# Patient Record
Sex: Male | Born: 1955 | Race: Black or African American | Hispanic: No | Smoking: Current every day smoker
Health system: Southern US, Community
[De-identification: ages and names within clinical notes are randomized; demographics above are authoritative.]

## PROBLEM LIST (undated history)

## (undated) DIAGNOSIS — G8929 Other chronic pain: Secondary | ICD-10-CM

## (undated) DIAGNOSIS — I10 Essential (primary) hypertension: Secondary | ICD-10-CM

## (undated) DIAGNOSIS — M549 Dorsalgia, unspecified: Secondary | ICD-10-CM

## (undated) HISTORY — PX: BACK SURGERY: SHX140

## (undated) HISTORY — PX: WISDOM TOOTH EXTRACTION: SHX21

---

## 1998-07-10 ENCOUNTER — Emergency Department (HOSPITAL_COMMUNITY): Admission: EM | Admit: 1998-07-10 | Discharge: 1998-07-10 | Payer: Self-pay | Admitting: Emergency Medicine

## 1998-07-10 ENCOUNTER — Encounter: Payer: Self-pay | Admitting: Emergency Medicine

## 1998-10-28 ENCOUNTER — Emergency Department (HOSPITAL_COMMUNITY): Admission: EM | Admit: 1998-10-28 | Discharge: 1998-10-28 | Payer: Self-pay | Admitting: Emergency Medicine

## 1998-10-28 ENCOUNTER — Encounter: Payer: Self-pay | Admitting: Emergency Medicine

## 1999-04-02 ENCOUNTER — Encounter: Payer: Self-pay | Admitting: Emergency Medicine

## 1999-04-02 ENCOUNTER — Emergency Department (HOSPITAL_COMMUNITY): Admission: EM | Admit: 1999-04-02 | Discharge: 1999-04-02 | Payer: Self-pay | Admitting: Emergency Medicine

## 1999-04-04 ENCOUNTER — Emergency Department (HOSPITAL_COMMUNITY): Admission: EM | Admit: 1999-04-04 | Discharge: 1999-04-04 | Payer: Self-pay | Admitting: Emergency Medicine

## 1999-04-09 ENCOUNTER — Emergency Department (HOSPITAL_COMMUNITY): Admission: EM | Admit: 1999-04-09 | Discharge: 1999-04-09 | Payer: Self-pay | Admitting: Emergency Medicine

## 2000-02-11 ENCOUNTER — Emergency Department (HOSPITAL_COMMUNITY): Admission: EM | Admit: 2000-02-11 | Discharge: 2000-02-11 | Payer: Self-pay | Admitting: Emergency Medicine

## 2000-04-16 ENCOUNTER — Emergency Department (HOSPITAL_COMMUNITY): Admission: EM | Admit: 2000-04-16 | Discharge: 2000-04-16 | Payer: Self-pay | Admitting: Emergency Medicine

## 2000-05-22 ENCOUNTER — Encounter: Payer: Self-pay | Admitting: Emergency Medicine

## 2000-05-22 ENCOUNTER — Emergency Department (HOSPITAL_COMMUNITY): Admission: EM | Admit: 2000-05-22 | Discharge: 2000-05-22 | Payer: Self-pay | Admitting: Emergency Medicine

## 2003-04-24 ENCOUNTER — Emergency Department (HOSPITAL_COMMUNITY): Admission: EM | Admit: 2003-04-24 | Discharge: 2003-04-24 | Payer: Self-pay | Admitting: Emergency Medicine

## 2004-04-22 ENCOUNTER — Emergency Department (HOSPITAL_COMMUNITY): Admission: EM | Admit: 2004-04-22 | Discharge: 2004-04-22 | Payer: Self-pay | Admitting: Emergency Medicine

## 2005-01-21 ENCOUNTER — Ambulatory Visit (HOSPITAL_COMMUNITY): Admission: RE | Admit: 2005-01-21 | Discharge: 2005-01-21 | Payer: Self-pay | Admitting: Orthopaedic Surgery

## 2005-02-17 ENCOUNTER — Ambulatory Visit (HOSPITAL_COMMUNITY): Admission: RE | Admit: 2005-02-17 | Discharge: 2005-02-17 | Payer: Self-pay | Admitting: Orthopaedic Surgery

## 2005-06-22 ENCOUNTER — Emergency Department (HOSPITAL_COMMUNITY): Admission: EM | Admit: 2005-06-22 | Discharge: 2005-06-22 | Payer: Self-pay | Admitting: Emergency Medicine

## 2006-06-22 ENCOUNTER — Ambulatory Visit (HOSPITAL_COMMUNITY): Admission: RE | Admit: 2006-06-22 | Discharge: 2006-06-22 | Payer: Self-pay | Admitting: Orthopaedic Surgery

## 2007-03-19 ENCOUNTER — Ambulatory Visit (HOSPITAL_COMMUNITY): Admission: RE | Admit: 2007-03-19 | Discharge: 2007-03-19 | Payer: Self-pay | Admitting: Orthopaedic Surgery

## 2007-03-22 ENCOUNTER — Ambulatory Visit (HOSPITAL_COMMUNITY): Admission: RE | Admit: 2007-03-22 | Discharge: 2007-03-23 | Payer: Self-pay | Admitting: Orthopaedic Surgery

## 2007-07-05 ENCOUNTER — Encounter: Admission: RE | Admit: 2007-07-05 | Discharge: 2007-10-03 | Payer: Self-pay | Admitting: Anesthesiology

## 2007-07-12 ENCOUNTER — Emergency Department (HOSPITAL_COMMUNITY): Admission: EM | Admit: 2007-07-12 | Discharge: 2007-07-12 | Payer: Self-pay | Admitting: Emergency Medicine

## 2007-11-05 ENCOUNTER — Emergency Department (HOSPITAL_COMMUNITY): Admission: EM | Admit: 2007-11-05 | Discharge: 2007-11-05 | Payer: Self-pay | Admitting: Emergency Medicine

## 2007-11-16 ENCOUNTER — Ambulatory Visit: Payer: Self-pay | Admitting: Pain Medicine

## 2008-03-01 ENCOUNTER — Encounter: Admission: RE | Admit: 2008-03-01 | Discharge: 2008-03-01 | Payer: Self-pay

## 2008-05-03 ENCOUNTER — Inpatient Hospital Stay (HOSPITAL_COMMUNITY): Admission: RE | Admit: 2008-05-03 | Discharge: 2008-05-09 | Payer: Self-pay | Admitting: Neurosurgery

## 2008-07-06 ENCOUNTER — Encounter: Admission: RE | Admit: 2008-07-06 | Discharge: 2008-09-07 | Payer: Self-pay | Admitting: Neurosurgery

## 2008-09-12 ENCOUNTER — Encounter: Admission: RE | Admit: 2008-09-12 | Discharge: 2008-12-11 | Payer: Self-pay | Admitting: Neurosurgery

## 2009-02-20 ENCOUNTER — Encounter: Admission: RE | Admit: 2009-02-20 | Discharge: 2009-02-20 | Payer: Self-pay | Admitting: Neurosurgery

## 2009-04-03 ENCOUNTER — Ambulatory Visit: Payer: Self-pay | Admitting: Pain Medicine

## 2009-04-19 ENCOUNTER — Ambulatory Visit: Payer: Self-pay | Admitting: Physician Assistant

## 2009-05-17 ENCOUNTER — Ambulatory Visit: Payer: Self-pay | Admitting: Physician Assistant

## 2009-06-07 ENCOUNTER — Emergency Department (HOSPITAL_COMMUNITY): Admission: EM | Admit: 2009-06-07 | Discharge: 2009-06-07 | Payer: Self-pay | Admitting: Emergency Medicine

## 2009-08-30 ENCOUNTER — Inpatient Hospital Stay (HOSPITAL_COMMUNITY): Admission: EM | Admit: 2009-08-30 | Discharge: 2009-08-31 | Payer: Self-pay | Admitting: Emergency Medicine

## 2009-09-08 HISTORY — PX: COLON SURGERY: SHX602

## 2009-10-02 ENCOUNTER — Ambulatory Visit (HOSPITAL_COMMUNITY): Admission: RE | Admit: 2009-10-02 | Discharge: 2009-10-02 | Payer: Self-pay | Admitting: Gastroenterology

## 2009-10-08 ENCOUNTER — Emergency Department (HOSPITAL_COMMUNITY): Admission: EM | Admit: 2009-10-08 | Discharge: 2009-10-08 | Payer: Self-pay | Admitting: Emergency Medicine

## 2009-10-31 ENCOUNTER — Encounter (INDEPENDENT_AMBULATORY_CARE_PROVIDER_SITE_OTHER): Payer: Self-pay | Admitting: General Surgery

## 2009-10-31 ENCOUNTER — Inpatient Hospital Stay (HOSPITAL_COMMUNITY): Admission: RE | Admit: 2009-10-31 | Discharge: 2009-11-06 | Payer: Self-pay | Admitting: General Surgery

## 2009-12-13 ENCOUNTER — Encounter: Admission: RE | Admit: 2009-12-13 | Discharge: 2009-12-13 | Payer: Self-pay | Admitting: General Surgery

## 2010-05-29 ENCOUNTER — Emergency Department (HOSPITAL_COMMUNITY): Admission: EM | Admit: 2010-05-29 | Discharge: 2010-05-29 | Payer: Self-pay | Admitting: Emergency Medicine

## 2010-09-28 ENCOUNTER — Encounter: Payer: Self-pay | Admitting: Orthopaedic Surgery

## 2010-09-29 ENCOUNTER — Encounter: Payer: Self-pay | Admitting: General Surgery

## 2010-09-29 ENCOUNTER — Encounter: Payer: Self-pay | Admitting: Orthopaedic Surgery

## 2010-11-24 LAB — COMPREHENSIVE METABOLIC PANEL
BUN: 4 mg/dL — ABNORMAL LOW (ref 6–23)
CO2: 27 mEq/L (ref 19–32)
Chloride: 107 mEq/L (ref 96–112)
Creatinine, Ser: 0.91 mg/dL (ref 0.4–1.5)
GFR calc non Af Amer: >60 mL/min (ref >60)
Total Bilirubin: 0.6 mg/dL (ref 0.3–1.2)

## 2010-11-24 LAB — DIFFERENTIAL
Basophils Absolute: 0 10*3/uL (ref 0.0–0.1)
Lymphocytes Relative: 56 % — ABNORMAL HIGH (ref 12–46)
Neutro Abs: 1.4 10*3/uL — ABNORMAL LOW (ref 1.7–7.7)
Neutrophils Relative %: 33 % — ABNORMAL LOW (ref 43–77)

## 2010-11-24 LAB — CBC
HCT: 40.5 % (ref 39.0–52.0)
MCV: 96.5 fL (ref 78.0–100.0)
RBC: 4.2 MIL/uL — ABNORMAL LOW (ref 4.22–5.81)
WBC: 4.3 10*3/uL (ref 4.0–10.5)

## 2010-11-24 LAB — URINALYSIS, ROUTINE W REFLEX MICROSCOPIC
Bilirubin Urine: NEGATIVE
Nitrite: NEGATIVE
Specific Gravity, Urine: 1.022 (ref 1.005–1.030)
Urobilinogen, UA: 0.2 mg/dL (ref 0.0–1.0)

## 2010-11-24 LAB — LIPASE, BLOOD: Lipase: 38 U/L (ref 11–59)

## 2010-11-27 LAB — CBC
HCT: 34.7 % — ABNORMAL LOW (ref 39.0–52.0)
Hemoglobin: 12.8 g/dL — ABNORMAL LOW (ref 13.0–17.0)
MCHC: 34 g/dL (ref 30.0–36.0)
MCHC: 34.3 g/dL (ref 30.0–36.0)
MCHC: 34.5 g/dL (ref 30.0–36.0)
MCHC: 34.5 g/dL (ref 30.0–36.0)
MCV: 94.5 fL (ref 78.0–100.0)
MCV: 95 fL (ref 78.0–100.0)
MCV: 95.3 fL (ref 78.0–100.0)
Platelets: 184 10*3/uL (ref 150–400)
Platelets: 233 10*3/uL (ref 150–400)
RBC: 3.68 MIL/uL — ABNORMAL LOW (ref 4.22–5.81)
RBC: 3.9 MIL/uL — ABNORMAL LOW (ref 4.22–5.81)
RBC: 4.59 MIL/uL (ref 4.22–5.81)
RBC: 4.97 MIL/uL (ref 4.22–5.81)
RDW: 12.5 % (ref 11.5–15.5)

## 2010-11-27 LAB — CHROMOGRANIN A: Chromogranin A: 6 ng/mL (ref 1.9–15.0)

## 2010-11-27 LAB — DIFFERENTIAL
Eosinophils Relative: 1 % (ref 0–5)
Lymphocytes Relative: 42 % (ref 12–46)
Lymphs Abs: 3.9 10*3/uL (ref 0.7–4.0)
Monocytes Absolute: 0.8 10*3/uL (ref 0.1–1.0)
Monocytes Relative: 8 % (ref 3–12)

## 2010-11-27 LAB — COMPREHENSIVE METABOLIC PANEL
AST: 50 U/L — ABNORMAL HIGH (ref 0–37)
Albumin: 4.9 g/dL (ref 3.5–5.2)
Calcium: 9.5 mg/dL (ref 8.4–10.5)
Chloride: 105 mEq/L (ref 96–112)
Creatinine, Ser: 1.47 mg/dL (ref 0.4–1.5)
GFR calc Af Amer: >60 mL/min (ref >60)
Total Bilirubin: 1.8 mg/dL — ABNORMAL HIGH (ref 0.3–1.2)
Total Protein: 8.7 g/dL — ABNORMAL HIGH (ref 6.0–8.3)

## 2010-11-27 LAB — BASIC METABOLIC PANEL
BUN: 2 mg/dL — ABNORMAL LOW (ref 6–23)
BUN: 3 mg/dL — ABNORMAL LOW (ref 6–23)
CO2: 25 mEq/L (ref 19–32)
CO2: 26 mEq/L (ref 19–32)
CO2: 27 mEq/L (ref 19–32)
Calcium: 10.6 mg/dL — ABNORMAL HIGH (ref 8.4–10.5)
Chloride: 107 mEq/L (ref 96–112)
Chloride: 112 mEq/L (ref 96–112)
Creatinine, Ser: 0.96 mg/dL (ref 0.4–1.5)
Creatinine, Ser: 1.06 mg/dL (ref 0.4–1.5)
GFR calc Af Amer: >60 mL/min (ref >60)
GFR calc Af Amer: >60 mL/min (ref >60)
Potassium: 3.4 mEq/L — ABNORMAL LOW (ref 3.5–5.1)
Potassium: 3.5 mEq/L (ref 3.5–5.1)
Sodium: 143 mEq/L (ref 135–145)

## 2010-12-04 ENCOUNTER — Inpatient Hospital Stay (INDEPENDENT_AMBULATORY_CARE_PROVIDER_SITE_OTHER)
Admission: RE | Admit: 2010-12-04 | Discharge: 2010-12-04 | Disposition: A | Discharge status: Home / Self Care | Payer: Medicaid Other | Source: Ambulatory Visit | Attending: Emergency Medicine | Admitting: Emergency Medicine

## 2010-12-04 DIAGNOSIS — T7840XA Allergy, unspecified, initial encounter: Secondary | ICD-10-CM

## 2010-12-09 LAB — COMPREHENSIVE METABOLIC PANEL
ALT: 58 U/L — ABNORMAL HIGH (ref 0–53)
AST: 54 U/L — ABNORMAL HIGH (ref 0–37)
Albumin: 4.5 g/dL (ref 3.5–5.2)
Alkaline Phosphatase: 67 U/L (ref 39–117)
CO2: 23 mEq/L (ref 19–32)
Chloride: 103 mEq/L (ref 96–112)
GFR calc Af Amer: >60 mL/min (ref >60)
GFR calc non Af Amer: >60 mL/min (ref >60)
Potassium: 4.3 mEq/L (ref 3.5–5.1)
Sodium: 138 mEq/L (ref 135–145)
Total Bilirubin: 1.2 mg/dL (ref 0.3–1.2)

## 2010-12-09 LAB — DIFFERENTIAL
Basophils Absolute: 0.1 10*3/uL (ref 0.0–0.1)
Eosinophils Absolute: 0 10*3/uL (ref 0.0–0.7)
Eosinophils Relative: 0 % (ref 0–5)
Lymphocytes Relative: 24 % (ref 12–46)
Monocytes Absolute: 0.2 10*3/uL (ref 0.1–1.0)

## 2010-12-09 LAB — CBC
HCT: 38.7 % — ABNORMAL LOW (ref 39.0–52.0)
Hemoglobin: 13.5 g/dL (ref 13.0–17.0)
MCHC: 35 g/dL (ref 30.0–36.0)
MCV: 94.6 fL (ref 78.0–100.0)
MCV: 95.2 fL (ref 78.0–100.0)
Platelets: 224 10*3/uL (ref 150–400)
RBC: 4.09 MIL/uL — ABNORMAL LOW (ref 4.22–5.81)
RBC: 4.9 MIL/uL (ref 4.22–5.81)
WBC: 7.3 10*3/uL (ref 4.0–10.5)

## 2010-12-09 LAB — BASIC METABOLIC PANEL
CO2: 24 mEq/L (ref 19–32)
Chloride: 107 mEq/L (ref 96–112)
GFR calc Af Amer: >60 mL/min (ref >60)
Sodium: 137 mEq/L (ref 135–145)

## 2010-12-09 LAB — LIPASE, BLOOD: Lipase: 19 U/L (ref 11–59)

## 2011-01-21 NOTE — Op Note (Signed)
[name], SWEEDEN NO.:  [id number]   MEDICAL RECORD NO.:  [id number]          PATIENT TYPE:  INP   LOCATION:  3001                         FACILITY:  [facility]   PHYSICIAN:  Hilda Lias, M.D.   DATE OF BIRTH:  [birth year]   DATE OF PROCEDURE:  05/03/2008  DATE OF DISCHARGE:                               OPERATIVE REPORT   PREOPERATIVE DIAGNOSIS:  Degenerative disk disease L4-L5, L5-S1 with  chronic radiculopathy status post right L4-L5, L5-S1 diskectomy.   POSTOPERATIVE DIAGNOSIS:  Degenerative disk disease L4-L5, L5-S1 with  chronic radiculopathy status post right L4-L5, L5-S1 diskectomy.   PROCEDURE:  Bilateral L4-L5 laminectomy and facetectomy; bilateral L4-L5  diskectomy; unilaterally left L5-S1 diskectomy; interbody fusion with  cages, bilateral at the level of L4-L5, unilateral at the level L5-S1;  pedicle screws L4, L5, and S1; posterolateral arthrodesis with BMP and  autograft; Cell Saver; C-arm.   SURGEON:  Hilda Lias, MD   ASSISTANT:  Cristi Loron, MD   CLINICAL HISTORY:  Mr. Granquist is a gentleman who had surgery elsewhere  more than year ago and since then he had been complaining of back pain  radiating to both legs, left worse than the right one.  The patient has  failed conservative treatment including aggressive pain management.  X-  rays showed that he has severe degenerative disk disease at L4-L5 and L5-  S1 __________ space.  Surgery was advised and the risks were explained  to him in the history and physical.   PROCEDURE:  The patient was taken to the OR, and after intubation he was  positioned in a prone manner.  The skin was cleaned with DuraPrep.  Midline incision along the __________ was made and muscles were  retracted laterally.  We found quite a bit of scar tissue at the level  of L4-L5 and L5-S1 on the right side.  X-rays showed that indeed we were  at the level L4-L5.  From then on, we removed the spinous process of  L4-  L5 and L5-S1 and then the lamina.  On the left side, we did facetectomy  to be able to get into the space.  The most difficult part was to do  lysis of tissue.  On the right side also it was quite difficult, but at  the end we were able to do the S1 nerve root.  At the level of L4-L5, we  entered the disk space and bilateral diskectomy was accomplished.  Then  2 cages of 8 x 22 with BMP and autograft were inserted.  At the L5-S1,  we entered the disk space on the left side and total diskectomy was done  from the left side.  On the right side, it was quite difficult to  __________ space.  Because of that, we introduced a __________ on the  left side with BMP.  Using the C-arm, we brought the pedicle at L4, L5,  and S1.  We introduced 6 pedicles of 5.5 x 45.  They were connected with  __________ and secured in place with caps.  The  foramen was wide opened.  The worst nerve, because of the __________,  was the L4 in the right side.  Then, we removed the __________ lateral  aspect of the facet and the transverse process of L4, L5, and S1 and a  mix of BMP and autograft was used for arthrodesis.  From then on, the  wound was closed with Vicryl and staples.           ______________________________  Hilda Lias, M.D.     EB/MEDQ  D:  05/03/2008  T:  05/04/2008  Job:  [id number]

## 2011-01-21 NOTE — H&P (Signed)
NAMEJARYAN, [name] NO.:  [id number]   MEDICAL RECORD NO.:  [id number]          PATIENT TYPE:  INP   LOCATION:  3001                         FACILITY:  [facility]   PHYSICIAN:  Hilda Lias, M.D.   DATE OF BIRTH:  1955/10/06   DATE OF ADMISSION:  05/03/2008  DATE OF DISCHARGE:                              HISTORY & PHYSICAL   [name] is a gentleman who has been seen by me since June of this  year complaining of back pain radiating down to the right leg for almost  a year.  In the past, he had surgery by Dr. Ophelia Charter at the L4-L5, L5-S1.  According to him, he did not do any better.  He has been followed by the  [facility] in [place] without any improvement.  It is quite  miserable.  The patient had a x-ray and he was sent to [place] for further  evaluation.   PAST MEDICAL HISTORY:  L4-L5, L5-S1 lumbar diskectomy.   ALLERGIES:  He is not allergic to any medication.   SOCIAL HISTORY:  He does not drink.  He smokes 3 cigarettes a day.   FAMILY HISTORY:  Unremarkable.   REVIEW OF SYSTEMS:  Positive for high blood pressure and back pain.   PHYSICAL EXAMINATION:  HEAD, EARS, NOSE, AND THROAT:  Normal.  NECK:  Normal.  LUNGS:  Clear.  HEART:  Heart sound normal.  ABDOMEN:  Normal.  EXTREMITIES:  Normal pulse.  NEURO:  He has decreased flexibility of the lumbar spine, his straight  leg raising is positive at 30 degrees.  He has a scar in the lumbar  area.   The x-ray showed that he has had severe degenerative disk disease at L4-  L5, L5-S1 with bilateral foraminal stenosis.   CLINICAL IMPRESSION:  1. Degenerative disk disease L4-L5, L5-S1with atrophy of the right      calf.  2. Status post L4-L5, L5-S1 laminectomy.   RECOMMENDATIONS:  The patient is being admitted for surgery.  Procedure  will be L4-L5, L5-S1 discectomy, interbody fusion with pedicle screw and  posterolateral arthrodesis.  The patient is fully aware of the risks  such as infection, CSF  leak, worsening pain, paralysis, no improvement  whatsoever, and need for further surgery.          ______________________________  Hilda Lias, M.D.    EB/MEDQ  D:  05/03/2008  T:  05/03/2008  Job:  [id number]

## 2011-01-21 NOTE — Discharge Summary (Signed)
[name], [name]             ACCOUNT NO.:  [id number]   MEDICAL RECORD NO.:  [id number]          PATIENT TYPE:  INP   LOCATION:  3001                         FACILITY:  [facility]   PHYSICIAN:  Hilda Lias, M.D.   DATE OF BIRTH:  1955/10/15   DATE OF ADMISSION:  05/03/2008  DATE OF DISCHARGE:  05/09/2008                               DISCHARGE SUMMARY   ADMISSION DIAGNOSES:  1. Degenerative disk disease, L4-L5 and L5-S1 with a chronic      radiculopathy.  2. Status post right L4-L5 and L5-S1 diskectomy.   FINAL DIAGNOSES:  1. Degenerative disk disease, L4-L5 and L5-S1 with a chronic      radiculopathy.  2. Status post right L4-L5 and L5-S1 diskectomy.   CLINICAL HISTORY:  The patient was admitted because of back pain rushing  to the right leg.  This gentleman in the past had surgery by orthopedic  surgeon.  Since then, he told me that he never got better, continue to  have a bit of more back pain.  X-ray shows severe degenerative disk  disease at the level of L4-L5 and L5-S1.  This patient was to proceed  with surgery.   LABORATORY DATA:  Normal.   COURSE IN THE HOSPITAL:  The patient was taken to surgery, L4-L5 and L5-  S1 diskectomy, interbody fusion with pedicle screw was done.  Today, the  patient has improved.  He still has limitation with walking.  The wound  looks fine.  He is being discharged, to be followed by me and by the  physical therapy.   CONDITION ON DISCHARGE:  Stable.   MEDICATION:  Oxycodone and diazepam.   DIET:  Regular.   ACTIVITY:  Not to drive for at least 3 weeks.   FOLLOWUP:  He will be seen in my office in 10 days to have the staples  out.           ______________________________  Hilda Lias, M.D.     EB/MEDQ  D:  05/09/2008  T:  05/09/2008  Job:  [id number]

## 2011-01-21 NOTE — Op Note (Signed)
[name], [name]             ACCOUNT NO.:  [id number]   MEDICAL RECORD NO.:  [id number]          PATIENT TYPE:  OIB   LOCATION:  5028                         FACILITY:  [facility]   PHYSICIAN:  Mark C. Ophelia Charter, M.D.    DATE OF BIRTH:  05/05/1956   DATE OF PROCEDURE:  03/22/2007  DATE OF DISCHARGE:                               OPERATIVE REPORT   PREOPERATIVE DIAGNOSES:  1. 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  2. Right L5-S1 foraminal stenosis.   POSTOPERATIVE DIAGNOSES:  1. 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  2. Right L5-S1 foraminal stenosis.   PROCEDURE:  1. Right L4-5 microdiskectomy.  2. Right L5-S1 foraminotomy.   SURGEON:  Annell Greening, MD   ASSISTANT:  RNFA.   ESTIMATED BLOOD LOSS:  Minimal.   DESCRIPTION OF PROCEDURE:  After the induction of general anesthesia  with orotracheal intubation, the patient was placed in the prone  position with standard prepping and draping with the Andrews frame in  the kneeling position.  A time-out procedure was taken.  Preoperative  antibiotics were given.  Area was squared with towels, Betadine and Vi-  Drape.  The patient is very thin and an incision was made starting a  couple millimeters to the right of the midline and extended distally to  cover both the 4-5 and 5-1 space.  Subperiosteal dissection was  performed and a Penfield 4 retractor was placed down to the level of the  ligamentum.  The patient had a very narrow space between the L4 and L5  lamina with minimal gap between the lamina at this level up.  [name]  was placed on top.  X-ray was taken, which showed that it was above the  4-5 space.  Bone was removed once a small curette been used to create  enough space to get a 2-mm Kerrison in between.  There was facet  overhang of bone and thick ligamentum which were excised.  There was  extensive fibrotic tissue in the lateral gutter at the 4-5 space and  once the floor of the canal was found with extensive fibrosis,  the top  portion of the lamina of 5 was removed as well, and a Penfield 4 was  placed out the base of the canal directly over the L5 vertebra and  halfway between the 2 disk spaces, confirming that this was the  appropriate spot.  At the 4-5 level, there was extensive disk  herniation.  A portion of it was calcified, which required  microdissection with the operative microscope, cutting pieces of the  calcified disk with a combination of scalpel and pushing it down with  Epstein curettes, grasping with a micro-pituitary and then sharp  excision and blunt dissection with curettes to remove the pieces.  Nerve  root was extensively fibrotic, stuck from the level the disk to the  midportion of the level of the pedicle and disk material was pushing the  dura from the pedicle away.  Finally, a plane was identified distal to  this area, followed up proximal and then coming down from the top.  A  nerve root shoulder was identified.  Just distal to the nerve root  takeoff at the level of the top of the pedicle, a small bleb occurred.  There was no spinal fluid leak.  It was slightly bigger than the size of  an 18-gauge spinal needle.  It was carefully inspected and some Tisseel  was mixed and just dripped over it.  A paddy was used to protect it  while the Tisseel was being mixed and continued dissection, removing  chunks of ligament and the old disk material out.  Overhanging spurs  were removed and nerve root was followed out the foramina past the  pedicle.  There were no fragments out the foramina.  Once the nerve root  was completely freed, finally a hockey-stick could be placed anterior to  the dura, passes were made inside the disk space, which would barely  allow a micro-pituitary in.  Disk material had extruded out, had  calcified and was causing the compression.  Once this was decompressed  down to the bony floor, nerve root was free.  Continued removal of the  L5 hemilaminotomy was  performed until the disk space at 5-1 was  encountered and then the foramina was enlarged.  Palpation with a hockey-  stick showed that was a facet overhang causing narrowing; once the bone  spurs from the facet had been trimmed back, this gave freedom to the  nerve root.  After irrigation with saline solution, inspection once  again in all areas was performed.  Tisseel was dripped over the area,  which sealed a tiny bleb, and again, there was no spinal fluid leakage.  Both nerve roots were checked again and were free.  A hockey-stick could  be placed anterior to the dura and out the foramina with no areas of  compression.  Operative microscope was removed.  The area was irrigated,  fascia closed 0 Vicryl, 2-0 in the subcutaneous tissue, 4-0 Vicryl  subcuticular closure, tincture of Benzoin, Steri-Strips, Marcaine  infiltration of the skin, postop dressing and transferred to the  recovery room.  Instrument count and needle count were correct.      Mark C. Ophelia Charter, M.D.  Electronically Signed     MCY/MEDQ  D:  03/22/2007  T:  03/23/2007  Job:  [id number]

## 2011-05-30 LAB — CBC
MCHC: 34.3
MCV: 92.8
Platelets: 241
RDW: 13.4

## 2011-05-30 LAB — URINALYSIS, ROUTINE W REFLEX MICROSCOPIC
Bilirubin Urine: NEGATIVE
Leukocytes, UA: NEGATIVE
Nitrite: NEGATIVE
Specific Gravity, Urine: 1.024
pH: 8.5 — ABNORMAL HIGH

## 2011-05-30 LAB — DIFFERENTIAL
Eosinophils Relative: 1
Lymphocytes Relative: 26
Lymphs Abs: 1.1

## 2011-05-30 LAB — COMPREHENSIVE METABOLIC PANEL
AST: 44 — ABNORMAL HIGH
Albumin: 4
CO2: 21
Calcium: 9.6
Creatinine, Ser: 0.94
GFR calc Af Amer: >60
GFR calc non Af Amer: >60

## 2011-05-30 LAB — URINE MICROSCOPIC-ADD ON

## 2011-05-30 LAB — LIPASE, BLOOD: Lipase: 27

## 2011-06-17 LAB — URINALYSIS, ROUTINE W REFLEX MICROSCOPIC
Ketones, ur: 15 — AB
Nitrite: NEGATIVE
Protein, ur: NEGATIVE
Urobilinogen, UA: 0.2
pH: 8.5 — ABNORMAL HIGH

## 2011-06-17 LAB — COMPREHENSIVE METABOLIC PANEL
ALT: 41
Albumin: 3.6
Alkaline Phosphatase: 59
Calcium: 9.3
Potassium: 4.4
Sodium: 140
Total Protein: 6.5

## 2011-06-17 LAB — DIFFERENTIAL
Basophils Relative: 1
Eosinophils Absolute: 0
Lymphs Abs: 0.8
Monocytes Absolute: 0.4
Monocytes Relative: 7
Neutro Abs: 4.6

## 2011-06-17 LAB — CBC
Platelets: 203
RDW: 12.7

## 2011-06-24 LAB — URINALYSIS, ROUTINE W REFLEX MICROSCOPIC
Glucose, UA: NEGATIVE
Ketones, ur: NEGATIVE
Nitrite: NEGATIVE
Protein, ur: NEGATIVE
Urobilinogen, UA: 0.2

## 2011-06-24 LAB — COMPREHENSIVE METABOLIC PANEL
AST: 50 — ABNORMAL HIGH
BUN: 7
CO2: 27
Calcium: 10.2
Chloride: 104
Creatinine, Ser: 0.9
GFR calc Af Amer: >60
GFR calc non Af Amer: >60
Total Bilirubin: 1.1

## 2011-06-24 LAB — DIFFERENTIAL
Basophils Absolute: 0.1
Eosinophils Relative: 3
Lymphocytes Relative: 51 — ABNORMAL HIGH
Lymphs Abs: 3.5 — ABNORMAL HIGH
Neutro Abs: 2.4
Neutrophils Relative %: 36 — ABNORMAL LOW

## 2011-06-24 LAB — CBC
HCT: 43
MCHC: 33.9
MCV: 92.8
RBC: 4.63

## 2011-06-24 LAB — PROTIME-INR
INR: 1
Prothrombin Time: 12.9

## 2011-06-24 LAB — APTT: aPTT: 28

## 2011-09-13 ENCOUNTER — Other Ambulatory Visit: Payer: Self-pay | Admitting: Cardiology

## 2011-10-04 ENCOUNTER — Other Ambulatory Visit: Payer: Self-pay | Admitting: Cardiology

## 2011-10-31 ENCOUNTER — Other Ambulatory Visit: Payer: Self-pay | Admitting: Cardiology

## 2012-03-08 ENCOUNTER — Other Ambulatory Visit: Payer: Self-pay | Admitting: Cardiology

## 2012-05-02 ENCOUNTER — Encounter (HOSPITAL_COMMUNITY): Payer: Self-pay | Admitting: Emergency Medicine

## 2012-05-02 ENCOUNTER — Emergency Department (HOSPITAL_COMMUNITY)
Admission: EM | Admit: 2012-05-02 | Discharge: 2012-05-02 | Disposition: A | Discharge status: Home / Self Care | Payer: Medicaid Other | Attending: Emergency Medicine | Admitting: Emergency Medicine

## 2012-05-02 ENCOUNTER — Emergency Department (HOSPITAL_COMMUNITY): Payer: Medicaid Other

## 2012-05-02 DIAGNOSIS — M5416 Radiculopathy, lumbar region: Secondary | ICD-10-CM

## 2012-05-02 DIAGNOSIS — I10 Essential (primary) hypertension: Secondary | ICD-10-CM | POA: Insufficient documentation

## 2012-05-02 DIAGNOSIS — IMO0002 Reserved for concepts with insufficient information to code with codable children: Secondary | ICD-10-CM | POA: Insufficient documentation

## 2012-05-02 DIAGNOSIS — G8929 Other chronic pain: Secondary | ICD-10-CM | POA: Insufficient documentation

## 2012-05-02 HISTORY — DX: Other chronic pain: G89.29

## 2012-05-02 HISTORY — DX: Dorsalgia, unspecified: M54.9

## 2012-05-02 HISTORY — DX: Essential (primary) hypertension: I10

## 2012-05-02 MED ORDER — HYDROMORPHONE HCL PF 2 MG/ML IJ SOLN
2.0000 mg | Freq: Once | INTRAMUSCULAR | Status: AC
  Administered 2012-05-02: 2 mg via INTRAMUSCULAR
  Filled 2012-05-02: qty 1

## 2012-05-02 MED ORDER — CYCLOBENZAPRINE HCL 10 MG PO TABS: 10.0000 mg | ORAL_TABLET | Freq: Two times a day (BID) | ORAL | Status: AC | PRN

## 2012-05-02 MED ORDER — OXYCODONE-ACETAMINOPHEN 10-325 MG PO TABS: 1.0000 | ORAL_TABLET | ORAL | Status: AC | PRN

## 2012-05-02 NOTE — ED Notes (Signed)
Pt c/o right lower back pain into hip and and down right leg x 2 days; pt sts hx of chronic back pain

## 2012-05-02 NOTE — ED Provider Notes (Addendum)
History  Scribed for Todd Sprout, MD, the patient was seen in room TR11C/TR11C. This chart was scribed by Candelaria Stagers. The patient's care started at 12:01 PM   CSN: [id number]  Arrival date & time 05/02/12  1025   First MD Initiated Contact with Patient 05/02/12 1141      Chief Complaint  Patient presents with  . Back Pain  . Leg Pain     Patient is a 56 y.o. male presenting with leg pain. The history is provided by the patient. No language interpreter was used.  Leg Pain    [name] is a 56 y.o. male who presents to the Emergency Department complaining of groin pain, right hip pain, and right lower back pain that started four days ago and became worse last night.  He denies testicular swelling or trouble urinating.  Pt reports that he takes oxycodone for chronic back pain daily and ran out yesterday.  He has no h/o hip pain.  He is also experiencing intermittent spasms.    Past Medical History  Diagnosis Date  . Chronic back pain   . Hypertension     History reviewed. No pertinent past surgical history.  History reviewed. No pertinent family history.  History  Substance Use Topics  . Smoking status: Never Smoker   . Smokeless tobacco: Not on file  . Alcohol Use: Yes     occasional      Review of Systems  Genitourinary: Negative for dysuria, scrotal swelling and difficulty urinating.       Groin pain   Musculoskeletal: Positive for back pain and arthralgias (right hip pain).  All other systems reviewed and are negative.    Allergies  Review of patient's allergies indicates no known allergies.  Home Medications   Current Outpatient Rx  Name Route Sig Dispense Refill  . AMLODIPINE BESYLATE 10 MG PO TABS Oral Take 10 mg by mouth daily.    Marland Kitchen BENAZEPRIL HCL 20 MG PO TABS Oral Take 20 mg by mouth 2 (two) times daily.     . CHOLINE FENOFIBRATE 135 MG PO CPDR Oral Take 135 mg by mouth daily.      BP 108/64  Pulse 79  Temp 98.2 F (36.8 C)  (Oral)  Resp 16  SpO2 96%  Physical Exam  Nursing note and vitals reviewed. Constitutional: He is oriented to person, place, and time. He appears well-developed and well-nourished. No distress.  HENT:  Head: Normocephalic and atraumatic.  Eyes: Conjunctivae are normal.  Neck: Normal range of motion.  Pulmonary/Chest: Effort normal.  Musculoskeletal:       Pain in the L-3 distribution radiating down the right leg with tenderness in the right inguinal area without rash, lymphadenopathy.  Good femoral pulses.  Pain with movement of the right leg.  Normal strength.  Mild tenderness in the para lumbar region.    Neurological: He is alert and oriented to person, place, and time.  Skin: Skin is warm and dry. He is not diaphoretic.  Psychiatric: He has a normal mood and affect.    ED Course  Procedures   DIAGNOSTIC STUDIES: Oxygen Saturation is 96% on room air, normal by my interpretation.    COORDINATION OF CARE:    Labs Reviewed - No data to display Dg Hip Complete Right  05/02/2012  *RADIOLOGY REPORT*  Clinical Data: Right hip pain.  RIGHT HIP - COMPLETE 2+ VIEW  Comparison: None  Findings: Both hips are normally located.  Mild symmetric hip joint degenerative changes  bilaterally.  No acute fracture or plain film evidence of avascular necrosis.  The pubic symphysis and SI joints are intact.  Lower lumbar fusion hardware is noted. Right scrotal density appears to be too dense to be a normal testicle.  It may be an implant.  IMPRESSION: No acute bony findings.   Original Report Authenticated By: P. Loralie Champagne, M.D.      1. Lumbar radiculopathy       MDM   Pt with gradual onset of back pain suggestive of radiculopathy in the L2-3 distribution vs hip pathology.  No neurovascular compromise and no incontinence.  Pt has no infectious sx, hx of CA  or other red flags concerning for pathologic back pain.  Pt is able to ambulate but is painful.  Normal strength and reflexes on exam.   Denies trauma. Will give pt pain control and to return for developement of above sx.  Hip films pending.  1:33 PM Plain films neg and pt feels better.  Will d/c home.  I personally performed the services described in this documentation, which was scribed in my presence.  The recorded information has been reviewed and considered.        Todd Sprout, MD 05/02/12 1227  Todd Sprout, MD 05/02/12 1333  Todd Sprout, MD 05/02/12 1335

## 2013-05-14 ENCOUNTER — Encounter (HOSPITAL_COMMUNITY): Payer: Self-pay | Admitting: Emergency Medicine

## 2013-05-14 ENCOUNTER — Emergency Department (HOSPITAL_COMMUNITY)
Admission: EM | Admit: 2013-05-14 | Discharge: 2013-05-14 | Disposition: A | Discharge status: Home / Self Care | Payer: Medicaid Other | Attending: Emergency Medicine | Admitting: Emergency Medicine

## 2013-05-14 DIAGNOSIS — M546 Pain in thoracic spine: Secondary | ICD-10-CM | POA: Insufficient documentation

## 2013-05-14 DIAGNOSIS — Z79899 Other long term (current) drug therapy: Secondary | ICD-10-CM | POA: Insufficient documentation

## 2013-05-14 DIAGNOSIS — I10 Essential (primary) hypertension: Secondary | ICD-10-CM | POA: Insufficient documentation

## 2013-05-14 DIAGNOSIS — M549 Dorsalgia, unspecified: Secondary | ICD-10-CM

## 2013-05-14 MED ORDER — OXYCODONE-ACETAMINOPHEN 5-325 MG PO TABS
1.0000 | ORAL_TABLET | Freq: Once | ORAL | Status: AC
  Administered 2013-05-14: 1 via ORAL
  Filled 2013-05-14: qty 1

## 2013-05-14 MED ORDER — HYDROCODONE-ACETAMINOPHEN 5-325 MG PO TABS: 1.0000 | ORAL_TABLET | Freq: Four times a day (QID) | ORAL | Status: DC | PRN

## 2013-05-14 MED ORDER — CYCLOBENZAPRINE HCL 10 MG PO TABS: 10.0000 mg | ORAL_TABLET | Freq: Three times a day (TID) | ORAL | Status: DC | PRN

## 2013-05-14 MED ORDER — KETOROLAC TROMETHAMINE 60 MG/2ML IM SOLN
60.0000 mg | Freq: Once | INTRAMUSCULAR | Status: AC
  Administered 2013-05-14: 60 mg via INTRAMUSCULAR
  Filled 2013-05-14: qty 2

## 2013-05-14 MED ORDER — PREDNISONE 50 MG PO TABS: 50.0000 mg | ORAL_TABLET | Freq: Every day | ORAL | Status: DC

## 2013-05-14 MED ORDER — PREDNISONE 20 MG PO TABS
50.0000 mg | ORAL_TABLET | Freq: Once | ORAL | Status: AC
  Administered 2013-05-14: 50 mg via ORAL
  Filled 2013-05-14: qty 3

## 2013-05-14 NOTE — ED Notes (Signed)
Pt. Stated, I'm having severe back pain since yesterday, no injury. Previous back surgeries.  My left arm also started hurting.

## 2013-05-14 NOTE — ED Provider Notes (Signed)
CSN: [id number]     Arrival date & time 05/14/13  0845 History   First MD Initiated Contact with Patient 05/14/13 0900     Chief Complaint  Patient presents with  . Back Pain   (Consider location/radiation/quality/duration/timing/severity/associated sxs/prior Treatment) HPI Patient presents emergency department with upper back pain.  Patient, states, that previous pain in his upper back.  Patient, states, that symptoms started 2 days, ago.  Patient denies chest pain, shortness of breath, nausea, vomiting, abdominal pain, weakness, numbness, dizziness, fever, cough, or syncope.  The patient, states, that he did not take any medications prior to arrival.  Patient, states, that palpation and movement make his pain, worse.  Patient, states nothing seems to make his condition, better Past Medical History  Diagnosis Date  . Chronic back pain   . Hypertension    History reviewed. No pertinent past surgical history. No family history on file. History  Substance Use Topics  . Smoking status: Never Smoker   . Smokeless tobacco: Not on file  . Alcohol Use: Yes     Comment: occasional    Review of Systems All other systems negative except as documented in the HPI. All pertinent positives and negatives as reviewed in the HPI. Allergies  Review of patient's allergies indicates no known allergies.  Home Medications   Current Outpatient Rx  Name  Route  Sig  Dispense  Refill  . amLODipine (NORVASC) 10 MG tablet   Oral   Take 10 mg by mouth daily.         . benazepril (LOTENSIN) 20 MG tablet   Oral   Take 20 mg by mouth 2 (two) times daily.          . Choline Fenofibrate (TRILIPIX) 135 MG capsule   Oral   Take 135 mg by mouth daily.          BP 104/67  Pulse 65  Temp(Src) 98 F (36.7 C) (Oral)  Resp 22  SpO2 97% Physical Exam  Nursing note and vitals reviewed. Constitutional: He is oriented to person, place, and time. He appears well-developed and well-nourished. No  distress.  HENT:  Head: Normocephalic and atraumatic.  Eyes: Pupils are equal, round, and reactive to light.  Cardiovascular: Normal rate, regular rhythm and normal heart sounds.   Pulmonary/Chest: Effort normal and breath sounds normal. No respiratory distress.  Neurological: He is alert and oriented to person, place, and time. He has normal strength. He displays normal reflexes. No sensory deficit. He exhibits normal muscle tone. Coordination normal. GCS eye subscore is 4. GCS verbal subscore is 5. GCS motor subscore is 6.    ED Course  Procedures (including critical care time)  Patient will be treated for upper lumbar strain.  Patient does not have any neurological deficits noted on exam with normal reflexes.  Patient will be given followup with the [facility].  Told to use ice and heat on his back MDM      Carlyle Dolly, PA-C 05/14/13 [phone]

## 2013-05-14 NOTE — ED Notes (Signed)
Pt has ride home.

## 2013-05-16 NOTE — ED Provider Notes (Signed)
History/physical exam/procedure(s) were performed by non-physician practitioner and as supervising physician I was immediately available for consultation/collaboration. I have reviewed all notes and am in agreement with care and plan.   Hilario Quarry, MD 05/16/13 [phone]

## 2013-06-01 ENCOUNTER — Encounter (HOSPITAL_COMMUNITY): Payer: Self-pay | Admitting: Nurse Practitioner

## 2013-06-01 ENCOUNTER — Emergency Department (HOSPITAL_COMMUNITY)
Admission: EM | Admit: 2013-06-01 | Discharge: 2013-06-01 | Disposition: A | Discharge status: Home / Self Care | Payer: Medicaid Other | Attending: Emergency Medicine | Admitting: Emergency Medicine

## 2013-06-01 DIAGNOSIS — I10 Essential (primary) hypertension: Secondary | ICD-10-CM | POA: Insufficient documentation

## 2013-06-01 DIAGNOSIS — Z79899 Other long term (current) drug therapy: Secondary | ICD-10-CM | POA: Insufficient documentation

## 2013-06-01 DIAGNOSIS — R209 Unspecified disturbances of skin sensation: Secondary | ICD-10-CM | POA: Insufficient documentation

## 2013-06-01 DIAGNOSIS — IMO0002 Reserved for concepts with insufficient information to code with codable children: Secondary | ICD-10-CM | POA: Insufficient documentation

## 2013-06-01 DIAGNOSIS — M549 Dorsalgia, unspecified: Secondary | ICD-10-CM | POA: Insufficient documentation

## 2013-06-01 DIAGNOSIS — G8929 Other chronic pain: Secondary | ICD-10-CM

## 2013-06-01 DIAGNOSIS — F17200 Nicotine dependence, unspecified, uncomplicated: Secondary | ICD-10-CM | POA: Insufficient documentation

## 2013-06-01 MED ORDER — KETOROLAC TROMETHAMINE 60 MG/2ML IM SOLN
60.0000 mg | Freq: Once | INTRAMUSCULAR | Status: AC
  Administered 2013-06-01: 60 mg via INTRAMUSCULAR
  Filled 2013-06-01: qty 2

## 2013-06-01 MED ORDER — DIAZEPAM 5 MG/ML IJ SOLN
5.0000 mg | Freq: Once | INTRAMUSCULAR | Status: AC
  Administered 2013-06-01: 5 mg via INTRAMUSCULAR

## 2013-06-01 MED ORDER — IBUPROFEN 600 MG PO TABS: 600.0000 mg | ORAL_TABLET | Freq: Four times a day (QID) | ORAL | Status: DC | PRN

## 2013-06-01 MED ORDER — DIAZEPAM 5 MG PO TABS: 5.0000 mg | ORAL_TABLET | Freq: Two times a day (BID) | ORAL | Status: DC

## 2013-06-01 MED ORDER — DIAZEPAM 5 MG/ML IJ SOLN
5.0000 mg | Freq: Once | INTRAMUSCULAR | Status: DC
  Filled 2013-06-01: qty 2

## 2013-06-01 NOTE — ED Notes (Signed)
Patient states he was here on 09/06 and not long before that for the same back pain.  Patient states he didn't get the flexeril filled, only the vicodin.   Patient states that "flexeril doesn't work and they only gave me 15 vicodin.   Patient advised has not yet followed up with [facility] "they can't give me an appointment until 10/13".

## 2013-06-01 NOTE — ED Notes (Signed)
Pt reports mid back pain, chronic with a history of 2 back surgeries. Pt was seen here for same and referred to [facility] but they are unable to see him until 10/13 and pain is too severe to wait

## 2013-06-01 NOTE — ED Notes (Signed)
Patient is alert and orientedx4.  Patient was explained discharge instructions and they understood them with no questions.  The patient is riding the bus home.

## 2013-06-01 NOTE — ED Provider Notes (Signed)
CSN: [id number]     Arrival date & time 06/01/13  1212 History  This chart was scribed for non-physician practitioner [name], PA-C, working with Loren Racer, MD by Dorothey Baseman, ED Scribe. This patient was seen in room TR06C/TR06C and the patient's care was started at 1:54 PM.    Chief Complaint  Patient presents with  . Back Pain   The history is provided by the patient. No language interpreter was used.   HPI Comments: [name] is a 57 y.o. Male with a history of chronic back pain who presents to the Emergency Department complaining of an intermittent, sharp thoracic and lumbar back pain onset 2 weeks ago. He reports associated intermittent numbness to the right leg. He denies any potential injury to the area. He denies paresthesias, fever, nausea, and emesis. He states that he was seen here on 05/14/2013  for similar complaints and that he was referred to [facility], but that they would not be able to see him until next month. He denies history of cancer or IV drug use. Patient reports that he normally takes Flexeril with temporary relief.   Past Medical History  Diagnosis Date  . Chronic back pain   . Hypertension    Past Surgical History  Procedure Laterality Date  . Back surgery     History reviewed. No pertinent family history. History  Substance Use Topics  . Smoking status: Current Every Day Smoker    Types: Cigarettes  . Smokeless tobacco: Not on file  . Alcohol Use: Yes     Comment: occasional    Review of Systems  All other systems reviewed and are negative.    Allergies  Vicodin  Home Medications   Current Outpatient Rx  Name  Route  Sig  Dispense  Refill  . amLODipine (NORVASC) 10 MG tablet   Oral   Take 10 mg by mouth daily.         . benazepril (LOTENSIN) 20 MG tablet   Oral   Take 20 mg by mouth 2 (two) times daily.          . Choline Fenofibrate (TRILIPIX) 135 MG capsule   Oral   Take 135 mg by mouth  daily.         . cyclobenzaprine (FLEXERIL) 10 MG tablet   Oral   Take 1 tablet (10 mg total) by mouth 3 (three) times daily as needed for muscle spasms.   15 tablet   0   . HYDROcodone-acetaminophen (NORCO/VICODIN) 5-325 MG per tablet   Oral   Take 1 tablet by mouth every 6 (six) hours as needed for pain.   15 tablet   0   . predniSONE (DELTASONE) 50 MG tablet   Oral   Take 1 tablet (50 mg total) by mouth daily.   5 tablet   0   . diazepam (VALIUM) 5 MG tablet   Oral   Take 1 tablet (5 mg total) by mouth 2 (two) times daily.   10 tablet   0   . ibuprofen (ADVIL,MOTRIN) 600 MG tablet   Oral   Take 1 tablet (600 mg total) by mouth every 6 (six) hours as needed for pain.   30 tablet   0    Triage Vitals: BP 115/90  Pulse 94  Temp(Src) 97.5 F (36.4 C) (Oral)  Resp 18  SpO2 98%  Physical Exam  Nursing note and vitals reviewed. Constitutional: He appears well-developed and well-nourished.  Pt  sitting comfortably in exam chair.  NAD.  HENT:  Head: Normocephalic and atraumatic.  Eyes: Conjunctivae are normal. No scleral icterus.  Neck: Normal range of motion.  Cardiovascular: Normal rate, regular rhythm and normal heart sounds.   Pulmonary/Chest: Effort normal and breath sounds normal. No respiratory distress. He has no wheezes. He has no rales. He exhibits no tenderness.  Abdominal: Soft. Bowel sounds are normal. He exhibits no distension. There is no tenderness.  Musculoskeletal: Normal range of motion.  Tenderness along thoracic and lumbar paraspinal muscles. Antalgic gait. No spinal tenderness, step offs or crepitus.   Neurological: He is alert. He has normal strength. No sensory deficit. GCS eye subscore is 4. GCS verbal subscore is 5. GCS motor subscore is 6.  Skin: Skin is warm and dry. No rash noted. No erythema.    ED Course  Procedures (including critical care time)  Medications  ketorolac (TORADOL) injection 60 mg (60 mg Intramuscular Given 06/01/13  1424)  diazepam (VALIUM) injection 5 mg (5 mg Intramuscular Given 06/01/13 1427)    DIAGNOSTIC STUDIES: Oxygen Saturation is 98% on room air, normal by my interpretation.    COORDINATION OF CARE: 1:57PM- Will order an injection of Toradol and Valium. Will discharge patient with Valium and ibuprofen. Advised patient to follow up with Pain Management. Discussed treatment plan with patient at bedside and patient verbalized agreement.     Labs Review Labs Reviewed - No data to display Imaging Review No results found.  MDM   1. Chronic back pain     Pt with chronic pain requesting pain medication.  No new trauma or injuries. No red flag symptoms. According to [place] controlled substance database, pt was just prescribed 30 day supply oxycodone on 05/26/13.  Will give valium and toradol injection in ED.  Advised pt he needs to f/u as planned with [facility] and to discuss pain management.  Rx: valium and ibuprofen.  I personally performed the services described in this documentation, which was scribed in my presence. The recorded information has been reviewed and is accurate.    [name], PA-C 06/01/13 1512

## 2013-06-02 NOTE — ED Provider Notes (Signed)
Medical screening examination/treatment/procedure(s) were performed by non-physician practitioner and as supervising physician I was immediately available for consultation/collaboration.   Raziah Funnell, MD 06/02/13 1253 

## 2013-06-10 ENCOUNTER — Encounter (HOSPITAL_COMMUNITY): Payer: Self-pay | Admitting: Emergency Medicine

## 2013-06-10 ENCOUNTER — Emergency Department (INDEPENDENT_AMBULATORY_CARE_PROVIDER_SITE_OTHER)
Admission: EM | Admit: 2013-06-10 | Discharge: 2013-06-10 | Disposition: A | Discharge status: Home / Self Care | Payer: Medicaid Other | Source: Home / Self Care | Attending: Family Medicine | Admitting: Family Medicine

## 2013-06-10 DIAGNOSIS — T148 Other injury of unspecified body region: Secondary | ICD-10-CM

## 2013-06-10 DIAGNOSIS — W57XXXA Bitten or stung by nonvenomous insect and other nonvenomous arthropods, initial encounter: Secondary | ICD-10-CM

## 2013-06-10 MED ORDER — TRIAMCINOLONE ACETONIDE 40 MG/ML IJ SUSP
40.0000 mg | Freq: Once | INTRAMUSCULAR | Status: AC
  Administered 2013-06-10: 40 mg via INTRAMUSCULAR

## 2013-06-10 MED ORDER — FLUTICASONE PROPIONATE 0.05 % EX CREA: TOPICAL_CREAM | Freq: Two times a day (BID) | CUTANEOUS | Status: DC

## 2013-06-10 MED ORDER — TRIAMCINOLONE ACETONIDE 40 MG/ML IJ SUSP
INTRAMUSCULAR | Status: AC
  Filled 2013-06-10: qty 1

## 2013-06-10 NOTE — ED Provider Notes (Addendum)
CSN: [id number]     Arrival date & time 06/10/13  1042 History   None    Chief Complaint  Patient presents with  . Rash    ?'s allergic reaction / insect bites. all over back and arms with swelling/pain/irritation.    (Consider location/radiation/quality/duration/timing/severity/associated sxs/prior Treatment) Patient is a 57 y.o. male presenting with rash. The history is provided by the patient.  Rash Pain severity:  No pain Onset quality:  Sudden Duration:  2 days Progression:  Unchanged Chronicity:  New Associated symptoms comment:  Itchy outbreak acrss upper back and arms    Past Medical History  Diagnosis Date  . Chronic back pain   . Hypertension    Past Surgical History  Procedure Laterality Date  . Back surgery     History reviewed. No pertinent family history. History  Substance Use Topics  . Smoking status: Current Every Day Smoker    Types: Cigarettes  . Smokeless tobacco: Not on file  . Alcohol Use: Yes     Comment: occasional    Review of Systems  Constitutional: Negative.   Skin: Positive for rash.    Allergies  Vicodin  Home Medications   Current Outpatient Rx  Name  Route  Sig  Dispense  Refill  . benazepril (LOTENSIN) 20 MG tablet   Oral   Take 20 mg by mouth 2 (two) times daily.          Marland Kitchen amLODipine (NORVASC) 10 MG tablet   Oral   Take 10 mg by mouth daily.         . Choline Fenofibrate (TRILIPIX) 135 MG capsule   Oral   Take 135 mg by mouth daily.         . cyclobenzaprine (FLEXERIL) 10 MG tablet   Oral   Take 1 tablet (10 mg total) by mouth 3 (three) times daily as needed for muscle spasms.   15 tablet   0   . diazepam (VALIUM) 5 MG tablet   Oral   Take 1 tablet (5 mg total) by mouth 2 (two) times daily.   10 tablet   0   . fluticasone (CUTIVATE) 0.05 % cream   Topical   Apply topically 2 (two) times daily.   60 g   1   . HYDROcodone-acetaminophen (NORCO/VICODIN) 5-325 MG per tablet   Oral   Take 1 tablet by  mouth every 6 (six) hours as needed for pain.   15 tablet   0   . ibuprofen (ADVIL,MOTRIN) 600 MG tablet   Oral   Take 1 tablet (600 mg total) by mouth every 6 (six) hours as needed for pain.   30 tablet   0   . predniSONE (DELTASONE) 50 MG tablet   Oral   Take 1 tablet (50 mg total) by mouth daily.   5 tablet   0    BP 139/83  Pulse 85  Temp(Src) 97.1 F (36.2 C) (Oral)  Resp 12  SpO2 100% Physical Exam  Nursing note and vitals reviewed. Constitutional: He is oriented to person, place, and time. He appears well-developed and well-nourished.  Neurological: He is alert and oriented to person, place, and time.  Skin: Skin is warm and dry. Rash noted. There is erythema.  Mult isolated lesions indurated with central vesicles c/w bugbites    ED Course  Procedures (including critical care time) Labs Review Labs Reviewed - No data to display Imaging Review No results found.  MDM  Linna Hoff, MD 06/10/13 1205  Linna Hoff, MD 06/10/13 [phone]

## 2013-06-10 NOTE — ED Notes (Signed)
C/o rash/?'s allergic reaction. Rash over back and arms. Pain and swelling with irritation. Used rubbing alcohol with no relief.  Denies changes in soaps and detergents and no new foods.

## 2013-06-20 ENCOUNTER — Ambulatory Visit: Payer: Medicaid Other | Attending: Internal Medicine | Admitting: Internal Medicine

## 2013-06-20 ENCOUNTER — Encounter: Payer: Self-pay | Admitting: Internal Medicine

## 2013-06-20 VITALS — BP 106/67 | HR 103 | Temp 98.1°F | Resp 16 | Ht 72.05 in | Wt 195.0 lb

## 2013-06-20 DIAGNOSIS — M549 Dorsalgia, unspecified: Secondary | ICD-10-CM

## 2013-06-20 DIAGNOSIS — G8929 Other chronic pain: Secondary | ICD-10-CM

## 2013-06-20 DIAGNOSIS — I10 Essential (primary) hypertension: Secondary | ICD-10-CM

## 2013-06-20 DIAGNOSIS — D3A020 Benign carcinoid tumor of the appendix: Secondary | ICD-10-CM | POA: Insufficient documentation

## 2013-06-20 DIAGNOSIS — C181 Malignant neoplasm of appendix: Secondary | ICD-10-CM

## 2013-06-20 LAB — CBC WITH DIFFERENTIAL/PLATELET
Hemoglobin: 14.1 g/dL (ref 13.0–17.0)
Lymphocytes Relative: 53 % — ABNORMAL HIGH (ref 12–46)
Lymphs Abs: 2.4 10*3/uL (ref 0.7–4.0)
Neutrophils Relative %: 36 % — ABNORMAL LOW (ref 43–77)
Platelets: 327 10*3/uL (ref 150–400)
RBC: 4.75 MIL/uL (ref 4.22–5.81)
WBC: 4.6 10*3/uL (ref 4.0–10.5)

## 2013-06-20 MED ORDER — TRAMADOL HCL 50 MG PO TABS: 50.0000 mg | ORAL_TABLET | Freq: Three times a day (TID) | ORAL | Status: DC | PRN

## 2013-06-20 NOTE — Progress Notes (Signed)
Patient ID: [name], male   DOB: Mar 11, 1956, 57 y.o.   MRN: [id number] Patient Demographics  [name], is a 57 y.o. male  ZHY:[id number]  EXB:[id number]  DOB - 13-Sep-1955  CC:  Chief Complaint  Patient presents with  . Establish Care       HPI: [name] is a 57 y.o. male here today to establish medical care. He has history of hypertension on amlodipine and benazepril, dyslipidemia on fenofibrate, chronic low back pain status post 2 surgeries in the past, COPD and extensive smoking history. His major complaint today is the low back pain and he wants a prescription for Percocet. From record, he has multiple ER visits in the past for the same complaint, there was a period of time he visited the ER and found to have multiple prescription from different centers for the same narcotics. He has no specific symptoms of neurological deficit, no tingling or numbness of the upper or lower limbs. He is currently on disability, continues to smoke cigarette. He claims to be compliant with blood pressure medication and is controlled. 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 He has followed up consistently with GI. He is also scheduled for a prostate check by a urologist according to patient. No abdominal swelling, no change in bowel habit, no weight loss Patient has No headache, No chest pain, No abdominal pain - No Nausea, No new weakness tingling or numbness, No Cough - SOB.  Allergies  Allergen Reactions  . Vicodin [Hydrocodone-Acetaminophen] Nausea Only   Past Medical History  Diagnosis Date  . Chronic back pain   . Hypertension    Current Outpatient Prescriptions on File Prior to Visit  Medication Sig Dispense Refill  . amLODipine (NORVASC) 10 MG tablet Take 10 mg by mouth daily.      . benazepril (LOTENSIN) 20 MG tablet Take 20 mg by mouth 2 (two) times daily.       . Choline Fenofibrate (TRILIPIX)  135 MG capsule Take 135 mg by mouth daily.      . cyclobenzaprine (FLEXERIL) 10 MG tablet Take 1 tablet (10 mg total) by mouth 3 (three) times daily as needed for muscle spasms.  15 tablet  0  . diazepam (VALIUM) 5 MG tablet Take 1 tablet (5 mg total) by mouth 2 (two) times daily.  10 tablet  0  . fluticasone (CUTIVATE) 0.05 % cream Apply topically 2 (two) times daily.  60 g  1  . HYDROcodone-acetaminophen (NORCO/VICODIN) 5-325 MG per tablet Take 1 tablet by mouth every 6 (six) hours as needed for pain.  15 tablet  0  . ibuprofen (ADVIL,MOTRIN) 600 MG tablet Take 1 tablet (600 mg total) by mouth every 6 (six) hours as needed for pain.  30 tablet  0  . predniSONE (DELTASONE) 50 MG tablet Take 1 tablet (50 mg total) by mouth daily.  5 tablet  0   No current facility-administered medications on file prior to visit.   History reviewed. No pertinent family history. History   Social History  . Marital Status: Divorced    Spouse Name: N/A    Number of Children: N/A  . Years of Education: N/A   Occupational History  . Not on file.   Social History Main Topics  . Smoking status: Current Every Day Smoker    Types: Cigarettes  . Smokeless tobacco: Not on file  . Alcohol Use: Yes  Comment: occasional  . Drug Use: No  . Sexual Activity: Yes    Birth Control/ Protection: Condom   Other Topics Concern  . Not on file   Social History Narrative  . No narrative on file    Review of Systems: Constitutional: Negative for fever, chills, diaphoresis, activity change, appetite change and fatigue. HENT: Negative for ear pain, nosebleeds, congestion, facial swelling, rhinorrhea, neck pain, neck stiffness and ear discharge.  Eyes: Negative for pain, discharge, redness, itching and visual disturbance. Respiratory: Negative for cough, choking, chest tightness, shortness of breath, wheezing and stridor.  Cardiovascular: Negative for chest pain, palpitations and leg swelling. Gastrointestinal:  Negative for abdominal distention. Genitourinary: Negative for dysuria, urgency, frequency, hematuria, flank pain, decreased urine volume, difficulty urinating and dyspareunia.  Musculoskeletal: +++back pain, no joint swelling, arthralgia and gait problem. Neurological: Negative for dizziness, tremors, seizures, syncope, facial asymmetry, speech difficulty, weakness, light-headedness, numbness and headaches.  Hematological: Negative for adenopathy. Does not bruise/bleed easily. Psychiatric/Behavioral: Negative for hallucinations, behavioral problems, confusion, dysphoric mood, decreased concentration and agitation.    Objective:   Filed Vitals:   06/20/13 1424  BP: 106/67  Pulse: 103  Temp: 98.1 F (36.7 C)  Resp: 16    Physical Exam: Constitutional: Patient appears well-developed and well-nourished. No distress. HENT: Normocephalic, atraumatic, External right and left ear normal. Oropharynx is clear and moist.  Eyes: Conjunctivae and EOM are normal. PERRLA, no scleral icterus. Neck: Normal ROM. Neck supple. No JVD. No tracheal deviation. No thyromegaly. CVS: RRR, S1/S2 +, no murmurs, no gallops, no carotid bruit.  Pulmonary: Effort and breath sounds normal, no stridor, rhonchi, wheezes, rales.  Abdominal: Healed midline infraumbilical surgical scar,  Soft. BS +, no distension, tenderness, rebound or guarding.  Musculoskeletal: Normal range of motion. No edema and no tenderness. Negative leg raising. Healed surgical scar on the lumbar spine area, no local tenderness . Lymphadenopathy: No lymphadenopathy noted, cervical, inguinal or axillary Neuro: Alert. Normal reflexes, muscle tone coordination. No cranial nerve deficit. Skin: Skin is warm and dry. No rash noted. Not diaphoretic. No erythema. No pallor. Psychiatric: Normal mood and affect. Behavior, judgment, thought content normal.  Lab Results  Component Value Date   WBC 8.2 11/03/2009   HGB 15.0 DELTA CHECK NOTED 11/03/2009    HCT 43.5 11/03/2009   MCV 95.0 11/03/2009   PLT 233 11/03/2009   Lab Results  Component Value Date   CREATININE 1.06 11/03/2009   BUN 3* 11/03/2009   NA 141 11/03/2009   K 4.7 DELTA CHECK NOTED 11/03/2009   CL 104 11/03/2009   CO2 27 11/03/2009    No results found for this basename: HGBA1C   Lipid Panel  No results found for this basename: chol, trig, hdl, cholhdl, vldl, ldlcalc       Assessment and plan:   Patient Active Problem List   Diagnosis Date Noted  . 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 Essential hypertension, benign 06/20/2013  . Chronic back pain     Plan: Tramadol 50 mg tablet by mouth 4 times a day when necessary pain  Continue the following medications: Amlodipine 10 mg tablet by mouth daily Benazepril 20 mg tablet by mouth daily Fenofibrate 135 mg capsule by mouth daily Flexeril 10 mg tablet by mouth 3 times a day  Labs today: CBC differential CMP Urinalysis Lipid panel   Patient counseled extensively about pain We discussed the clinic policy on narcotics prescription, if tramadol does not help we'll refer patient to pain clinic Patient counseled  extensively about smoking cessation Patient also counseled about nutrition and exercise      Health Maintenance  -Influenza: Declined  Follow up in 3 months   The patient was given clear instructions to go to ER or return to medical center if symptoms don't improve, worsen or new problems develop. The patient verbalized understanding. The patient was told to call to get lab results if they haven't heard anything in the next week.   Jeanann Lewandowsky, MD, MHA, FACP, FAAP [facility] [place], [place] [phone]   06/20/2013, 2:41 PM

## 2013-06-20 NOTE — Patient Instructions (Signed)
DASH Diet The DASH diet stands for "Dietary Approaches to Stop Hypertension." It is a healthy eating plan that has been shown to reduce high blood pressure (hypertension) in as little as 14 days, while also possibly providing other significant health benefits. These other health benefits include reducing the risk of breast cancer after menopause and reducing the risk of type 2 diabetes, heart disease, colon cancer, and stroke. Health benefits also include weight loss and slowing kidney failure in patients with chronic kidney disease.  DIET GUIDELINES  Limit salt (sodium). Your diet should contain less than 1500 mg of sodium daily.  Limit refined or processed carbohydrates. Your diet should include mostly whole grains. Desserts and added sugars should be used sparingly.  Include small amounts of heart-healthy fats. These types of fats include nuts, oils, and tub margarine. Limit saturated and trans fats. These fats have been shown to be harmful in the body. CHOOSING FOODS  The following food groups are based on a 2000 calorie diet. See your Registered Dietitian for individual calorie needs. Grains and Grain Products (6 to 8 servings daily)  Eat More Often: Whole-wheat bread, brown rice, whole-grain or wheat pasta, quinoa, popcorn without added fat or salt (air popped).  Eat Less Often: White bread, white pasta, white rice, cornbread. Vegetables (4 to 5 servings daily)  Eat More Often: Fresh, frozen, and canned vegetables. Vegetables may be raw, steamed, roasted, or grilled with a minimal amount of fat.  Eat Less Often/Avoid: Creamed or fried vegetables. Vegetables in a cheese sauce. Fruit (4 to 5 servings daily)  Eat More Often: All fresh, canned (in natural juice), or frozen fruits. Dried fruits without added sugar. One hundred percent fruit juice ( cup [237 mL] daily).  Eat Less Often: Dried fruits with added sugar. Canned fruit in light or heavy syrup. Lean Meats, Fish, and Poultry (2  servings or less daily. One serving is 3 to 4 oz [85-114 g]).  Eat More Often: Ninety percent or leaner ground beef, tenderloin, sirloin. Round cuts of beef, chicken breast, turkey breast. All fish. Grill, bake, or broil your meat. Nothing should be fried.  Eat Less Often/Avoid: Fatty cuts of meat, turkey, or chicken leg, thigh, or wing. Fried cuts of meat or fish. Dairy (2 to 3 servings)  Eat More Often: Low-fat or fat-free milk, low-fat plain or light yogurt, reduced-fat or part-skim cheese.  Eat Less Often/Avoid: Milk (whole, 2%).Whole milk yogurt. Full-fat cheeses. Nuts, Seeds, and Legumes (4 to 5 servings per week)  Eat More Often: All without added salt.  Eat Less Often/Avoid: Salted nuts and seeds, canned beans with added salt. Fats and Sweets (limited)  Eat More Often: Vegetable oils, tub margarines without trans fats, sugar-free gelatin. Mayonnaise and salad dressings.  Eat Less Often/Avoid: Coconut oils, palm oils, butter, stick margarine, cream, half and half, cookies, candy, pie. FOR MORE INFORMATION The Dash Diet Eating Plan: www.dashdiet.org Document Released: 08/14/2011 Document Revised: 11/17/2011 Document Reviewed: 08/14/2011 [employer] Patient Information 2014 [employer]. Hypertension As your heart beats, it forces blood through your arteries. This force is your blood pressure. If the pressure is too high, it is called hypertension (HTN) or high blood pressure. HTN is dangerous because you may have it and not know it. High blood pressure may mean that your heart has to work harder to pump blood. Your arteries may be narrow or stiff. The extra work puts you at risk for heart disease, stroke, and other problems.  Blood pressure consists of two numbers, a   higher number over a lower, 110/72, for example. It is stated as "110 over 72." The ideal is below 120 for the top number (systolic) and under 80 for the bottom (diastolic). Write down your blood pressure today. You  should pay close attention to your blood pressure if you have certain conditions such as:  Heart failure.  Prior heart attack.  Diabetes  Chronic kidney disease.  Prior stroke.  Multiple risk factors for heart disease. To see if you have HTN, your blood pressure should be measured while you are seated with your arm held at the level of the heart. It should be measured at least twice. A one-time elevated blood pressure reading (especially in the Emergency Department) does not mean that you need treatment. There may be conditions in which the blood pressure is different between your right and left arms. It is important to see your caregiver soon for a recheck. Most people have essential hypertension which means that there is not a specific cause. This type of high blood pressure may be lowered by changing lifestyle factors such as:  Stress.  Smoking.  Lack of exercise.  Excessive weight.  Drug/tobacco/alcohol use.  Eating less salt. Most people do not have symptoms from high blood pressure until it has caused damage to the body. Effective treatment can often prevent, delay or reduce that damage. TREATMENT  When a cause has been identified, treatment for high blood pressure is directed at the cause. There are a large number of medications to treat HTN. These fall into several categories, and your caregiver will help you select the medicines that are best for you. Medications may have side effects. You should review side effects with your caregiver. If your blood pressure stays high after you have made lifestyle changes or started on medicines,   Your medication(s) may need to be changed.  Other problems may need to be addressed.  Be certain you understand your prescriptions, and know how and when to take your medicine.  Be sure to follow up with your caregiver within the time frame advised (usually within two weeks) to have your blood pressure rechecked and to review your  medications.  If you are taking more than one medicine to lower your blood pressure, make sure you know how and at what times they should be taken. Taking two medicines at the same time can result in blood pressure that is too low. SEEK IMMEDIATE MEDICAL CARE IF:  You develop a severe headache, blurred or changing vision, or confusion.  You have unusual weakness or numbness, or a faint feeling.  You have severe chest or abdominal pain, vomiting, or breathing problems. MAKE SURE YOU:   Understand these instructions.  Will watch your condition.  Will get help right away if you are not doing well or get worse. Document Released: 08/25/2005 Document Revised: 11/17/2011 Document Reviewed: 04/14/2008 [facility] Patient Information 2014 [place], [place]. Chronic Back Pain  When back pain lasts longer than 3 months, it is called chronic back pain.People with chronic back pain often go through certain periods that are more intense (flare-ups).  CAUSES Chronic back pain can be caused by wear and tear (degeneration) on different structures in your back. These structures include:  The bones of your spine (vertebrae) and the joints surrounding your spinal cord and nerve roots (facets).  The strong, fibrous tissues that connect your vertebrae (ligaments). Degeneration of these structures may result in pressure on your nerves. This can lead to constant pain. HOME CARE INSTRUCTIONS  Avoid bending, heavy lifting, prolonged sitting, and activities which make the problem worse.  Take brief periods of rest throughout the day to reduce your pain. Lying down or standing usually is better than sitting while you are resting.  Take over-the-counter or prescription medicines only as directed by your caregiver. SEEK IMMEDIATE MEDICAL CARE IF:   You have weakness or numbness in one of your legs or feet.  You have trouble controlling your bladder or bowels.  You have nausea, vomiting, abdominal pain,  shortness of breath, or fainting. Document Released: 10/02/2004 Document Revised: 11/17/2011 Document Reviewed: 08/09/2011 [facility] Patient Information 2014 [place], [place].

## 2013-06-20 NOTE — Progress Notes (Signed)
Pt is here to est care C/o lower back pain... Hx of back surgery Sxs include: constant pain, increases w/activity... Intermittent numbness/tingly of right leg Pt ambulated well to exam room w/NAD Alert w/no signs of acute distress.

## 2013-06-21 ENCOUNTER — Telehealth: Payer: Self-pay | Admitting: Emergency Medicine

## 2013-06-21 LAB — CMP AND LIVER
ALT: 27 U/L (ref 0–53)
Albumin: 4.3 g/dL (ref 3.5–5.2)
Bilirubin, Direct: 0.1 mg/dL (ref 0.0–0.3)
CO2: 26 mEq/L (ref 19–32)
Calcium: 9.6 mg/dL (ref 8.4–10.5)
Chloride: 105 mEq/L (ref 96–112)
Potassium: 4.7 mEq/L (ref 3.5–5.3)
Sodium: 140 mEq/L (ref 135–145)
Total Protein: 7.2 g/dL (ref 6.0–8.3)

## 2013-06-21 LAB — URINALYSIS, COMPLETE
Bacteria, UA: NONE SEEN
Bilirubin Urine: NEGATIVE
Crystals: NONE SEEN
Hgb urine dipstick: NEGATIVE
Ketones, ur: NEGATIVE mg/dL
Specific Gravity, Urine: 1.01 (ref 1.005–1.030)
Urobilinogen, UA: 0.2 mg/dL (ref 0.0–1.0)

## 2013-06-21 LAB — LIPID PANEL: HDL: 71 mg/dL (ref >39)

## 2013-06-21 NOTE — Telephone Encounter (Signed)
Message copied by Darlis Loan on Tue Jun 21, 2013  3:56 PM ------      Message from: Quentin Angst      Created: Tue Jun 21, 2013  2:25 PM       Please inform patient that all the lab results are within normal limits ------

## 2013-06-21 NOTE — Telephone Encounter (Signed)
Message copied by Darlis Loan on Tue Jun 21, 2013  2:44 PM ------      Message from: Quentin Angst      Created: Tue Jun 21, 2013  2:25 PM       Please inform patient that all the lab results are within normal limits ------

## 2013-06-21 NOTE — Telephone Encounter (Signed)
Pt mother given normal lab results

## 2013-06-21 NOTE — Telephone Encounter (Signed)
Attempted to leave message. Line busy

## 2013-07-14 NOTE — Progress Notes (Signed)
Dr. Barbette Merino  -  Please enter pt's preop orders in Epic - Thanks.

## 2013-07-19 ENCOUNTER — Encounter (HOSPITAL_COMMUNITY): Payer: Self-pay | Admitting: Pharmacy Technician

## 2013-07-21 ENCOUNTER — Encounter (HOSPITAL_COMMUNITY): Payer: Self-pay

## 2013-07-21 ENCOUNTER — Encounter (HOSPITAL_COMMUNITY)
Admission: RE | Admit: 2013-07-21 | Discharge: 2013-07-21 | Disposition: A | Discharge status: Home / Self Care | Payer: Medicaid Other | Source: Ambulatory Visit | Attending: Oral Surgery | Admitting: Oral Surgery

## 2013-07-21 ENCOUNTER — Ambulatory Visit (HOSPITAL_COMMUNITY)
Admission: RE | Admit: 2013-07-21 | Discharge: 2013-07-21 | Disposition: A | Discharge status: Home / Self Care | Payer: Medicaid Other | Source: Ambulatory Visit | Attending: Oral Surgery | Admitting: Oral Surgery

## 2013-07-21 DIAGNOSIS — I10 Essential (primary) hypertension: Secondary | ICD-10-CM | POA: Insufficient documentation

## 2013-07-21 DIAGNOSIS — Z01818 Encounter for other preprocedural examination: Secondary | ICD-10-CM | POA: Insufficient documentation

## 2013-07-21 DIAGNOSIS — Z01812 Encounter for preprocedural laboratory examination: Secondary | ICD-10-CM | POA: Insufficient documentation

## 2013-07-21 DIAGNOSIS — J449 Chronic obstructive pulmonary disease, unspecified: Secondary | ICD-10-CM | POA: Insufficient documentation

## 2013-07-21 DIAGNOSIS — J4489 Other specified chronic obstructive pulmonary disease: Secondary | ICD-10-CM | POA: Insufficient documentation

## 2013-07-21 DIAGNOSIS — Z01810 Encounter for preprocedural cardiovascular examination: Secondary | ICD-10-CM | POA: Insufficient documentation

## 2013-07-21 LAB — CBC
HCT: 45.1 % (ref 39.0–52.0)
MCV: 89.7 fL (ref 78.0–100.0)
RBC: 5.03 MIL/uL (ref 4.22–5.81)
RDW: 14.4 % (ref 11.5–15.5)
WBC: 6.1 10*3/uL (ref 4.0–10.5)

## 2013-07-21 LAB — BASIC METABOLIC PANEL
BUN: 11 mg/dL (ref 6–23)
CO2: 24 mEq/L (ref 19–32)
Chloride: 104 mEq/L (ref 96–112)
Creatinine, Ser: 0.99 mg/dL (ref 0.50–1.35)

## 2013-07-21 NOTE — Patient Instructions (Signed)
YOUR SURGERY IS SCHEDULED AT [facility]  ON:  Friday  11/14  REPORT TO  SHORT STAY CENTER AT:  8:30 AM      PHONE # FOR SHORT STAY IS [phone]  DO NOT EAT OR DRINK ANYTHING AFTER MIDNIGHT THE NIGHT BEFORE YOUR SURGERY.  YOU MAY BRUSH YOUR TEETH, RINSE OUT YOUR MOUTH--BUT NO WATER, NO FOOD, NO CHEWING GUM, NO MINTS, NO CANDIES, NO CHEWING TOBACCO.  PLEASE TAKE THE FOLLOWING MEDICATIONS THE AM OF YOUR SURGERY WITH A FEW SIPS OF WATER:   NORVASC, OXYCONTIN    DO NOT BRING VALUABLES, MONEY, CREDIT CARDS.  DO NOT WEAR JEWELRY, MAKE-UP, NAIL POLISH AND NO METAL PINS OR CLIPS IN YOUR HAIR. CONTACT LENS, DENTURES / PARTIALS, GLASSES SHOULD NOT BE WORN TO SURGERY AND IN MOST CASES-HEARING AIDS WILL NEED TO BE REMOVED.  BRING YOUR GLASSES CASE, ANY EQUIPMENT NEEDED FOR YOUR CONTACT LENS. FOR PATIENTS ADMITTED TO THE HOSPITAL--CHECK OUT TIME THE DAY OF DISCHARGE IS 11:00 AM.  ALL INPATIENT ROOMS ARE PRIVATE - WITH BATHROOM, TELEPHONE, TELEVISION AND WIFI INTERNET.  IF YOU ARE BEING DISCHARGED THE SAME DAY OF YOUR SURGERY--YOU CAN NOT DRIVE YOURSELF HOME--AND SHOULD NOT GO HOME ALONE BY TAXI OR BUS.  NO DRIVING OR OPERATING MACHINERY, DO NOT MAKE LEGAL DECISIONS FOR 24 HOURS FOLLOWING ANESTHESIA / PAIN MEDICATIONS.  PLEASE MAKE ARRANGEMENTS FOR SOMEONE TO BE WITH YOU AT HOME THE FIRST 24 HOURS AFTER SURGERY. RESPONSIBLE DRIVER'S NAME / PHONE  TAMMY MOORE - [phone]                                                    FAILURE TO FOLLOW THESE INSTRUCTIONS MAY RESULT IN THE CANCELLATION OF YOUR SURGERY. PLEASE BE AWARE THAT YOU MAY NEED ADDITIONAL BLOOD DRAWN DAY OF YOUR SURGERY  PATIENT SIGNATURE_________________________________

## 2013-07-21 NOTE — Pre-Procedure Instructions (Signed)
EKG AND CXR WERE DONE TODAY - PREOP AT [facility]. 

## 2013-07-21 NOTE — Pre-Procedure Instructions (Signed)
PT'S CHART TAKEN TO SHORT STAY - BMET RESULTS STILL PENDING - LAB STATES BLOOD ON MACHINE.

## 2013-07-22 ENCOUNTER — Encounter (HOSPITAL_COMMUNITY)
Admission: RE | Disposition: A | Discharge status: Home / Self Care | Payer: Self-pay | Source: Ambulatory Visit | Attending: Oral Surgery

## 2013-07-22 ENCOUNTER — Encounter (HOSPITAL_COMMUNITY): Payer: Self-pay | Admitting: *Deleted

## 2013-07-22 ENCOUNTER — Ambulatory Visit (HOSPITAL_COMMUNITY)
Admission: RE | Admit: 2013-07-22 | Discharge: 2013-07-22 | Disposition: A | Discharge status: Home / Self Care | Payer: Medicaid Other | Source: Ambulatory Visit | Attending: Oral Surgery | Admitting: Oral Surgery

## 2013-07-22 ENCOUNTER — Encounter (HOSPITAL_COMMUNITY): Payer: Medicaid Other | Admitting: Certified Registered Nurse Anesthetist

## 2013-07-22 ENCOUNTER — Ambulatory Visit (HOSPITAL_COMMUNITY): Payer: Medicaid Other | Admitting: Certified Registered Nurse Anesthetist

## 2013-07-22 DIAGNOSIS — M270 Developmental disorders of jaws: Secondary | ICD-10-CM

## 2013-07-22 DIAGNOSIS — Z981 Arthrodesis status: Secondary | ICD-10-CM | POA: Insufficient documentation

## 2013-07-22 DIAGNOSIS — G8929 Other chronic pain: Secondary | ICD-10-CM | POA: Insufficient documentation

## 2013-07-22 DIAGNOSIS — Z01812 Encounter for preprocedural laboratory examination: Secondary | ICD-10-CM | POA: Insufficient documentation

## 2013-07-22 DIAGNOSIS — J4489 Other specified chronic obstructive pulmonary disease: Secondary | ICD-10-CM | POA: Insufficient documentation

## 2013-07-22 DIAGNOSIS — I10 Essential (primary) hypertension: Secondary | ICD-10-CM | POA: Insufficient documentation

## 2013-07-22 DIAGNOSIS — F17200 Nicotine dependence, unspecified, uncomplicated: Secondary | ICD-10-CM | POA: Insufficient documentation

## 2013-07-22 DIAGNOSIS — Z01810 Encounter for preprocedural cardiovascular examination: Secondary | ICD-10-CM | POA: Insufficient documentation

## 2013-07-22 DIAGNOSIS — K029 Dental caries, unspecified: Secondary | ICD-10-CM

## 2013-07-22 DIAGNOSIS — D3A020 Benign carcinoid tumor of the appendix: Secondary | ICD-10-CM

## 2013-07-22 DIAGNOSIS — J449 Chronic obstructive pulmonary disease, unspecified: Secondary | ICD-10-CM | POA: Insufficient documentation

## 2013-07-22 DIAGNOSIS — R209 Unspecified disturbances of skin sensation: Secondary | ICD-10-CM | POA: Insufficient documentation

## 2013-07-22 DIAGNOSIS — M278 Other specified diseases of jaws: Secondary | ICD-10-CM | POA: Insufficient documentation

## 2013-07-22 DIAGNOSIS — M545 Low back pain, unspecified: Secondary | ICD-10-CM | POA: Insufficient documentation

## 2013-07-22 HISTORY — PX: MULTIPLE EXTRACTIONS WITH ALVEOLOPLASTY: SHX5342

## 2013-07-22 SURGERY — MULTIPLE EXTRACTION WITH ALVEOLOPLASTY
Anesthesia: General | Site: Mouth

## 2013-07-22 MED ORDER — CEFAZOLIN SODIUM-DEXTROSE 2-3 GM-% IV SOLR
2.0000 g | INTRAVENOUS | Status: AC
  Administered 2013-07-22: 2 g via INTRAVENOUS

## 2013-07-22 MED ORDER — HYDRALAZINE HCL 20 MG/ML IJ SOLN
2.5000 mg | Freq: Once | INTRAMUSCULAR | Status: AC
  Administered 2013-07-22 (×2): 3 mg via INTRAVENOUS

## 2013-07-22 MED ORDER — SUCCINYLCHOLINE CHLORIDE 20 MG/ML IJ SOLN
INTRAMUSCULAR | Status: DC | PRN
  Administered 2013-07-22: 100 mg via INTRAVENOUS

## 2013-07-22 MED ORDER — OXYMETAZOLINE HCL 0.05 % NA SOLN
NASAL | Status: AC
  Filled 2013-07-22: qty 15

## 2013-07-22 MED ORDER — FENTANYL CITRATE 0.05 MG/ML IJ SOLN
INTRAMUSCULAR | Status: AC
  Filled 2013-07-22: qty 2

## 2013-07-22 MED ORDER — OXYMETAZOLINE HCL 0.05 % NA SOLN
1.0000 | Freq: Two times a day (BID) | NASAL | Status: DC
  Administered 2013-07-22 (×2): 2 via NASAL

## 2013-07-22 MED ORDER — OXYCODONE-ACETAMINOPHEN 5-325 MG PO TABS
1.0000 | ORAL_TABLET | ORAL | Status: DC | PRN
  Administered 2013-07-22: 2 via ORAL
  Filled 2013-07-22: qty 2

## 2013-07-22 MED ORDER — ONDANSETRON HCL 4 MG/2ML IJ SOLN
INTRAMUSCULAR | Status: DC | PRN
  Administered 2013-07-22: 4 mg via INTRAVENOUS

## 2013-07-22 MED ORDER — HYDROMORPHONE HCL PF 1 MG/ML IJ SOLN
0.2500 mg | INTRAMUSCULAR | Status: DC | PRN
  Administered 2013-07-22: 0.5 mg via INTRAVENOUS
  Administered 2013-07-22 (×2): 0.25 mg via INTRAVENOUS

## 2013-07-22 MED ORDER — HYDROMORPHONE HCL PF 1 MG/ML IJ SOLN
INTRAMUSCULAR | Status: AC
  Filled 2013-07-22: qty 1

## 2013-07-22 MED ORDER — LABETALOL HCL 5 MG/ML IV SOLN
INTRAVENOUS | Status: AC
  Filled 2013-07-22: qty 4

## 2013-07-22 MED ORDER — OXYCODONE-ACETAMINOPHEN 10-325 MG PO TABS: 1.0000 | ORAL_TABLET | ORAL | Status: DC | PRN

## 2013-07-22 MED ORDER — LIDOCAINE-EPINEPHRINE 2 %-1:100000 IJ SOLN
INTRAMUSCULAR | Status: AC
  Filled 2013-07-22: qty 2

## 2013-07-22 MED ORDER — LIDOCAINE HCL (CARDIAC) 20 MG/ML IV SOLN
INTRAVENOUS | Status: DC | PRN
  Administered 2013-07-22: 80 mg via INTRAVENOUS

## 2013-07-22 MED ORDER — HYDRALAZINE HCL 20 MG/ML IJ SOLN
INTRAMUSCULAR | Status: AC
  Filled 2013-07-22: qty 1

## 2013-07-22 MED ORDER — FENTANYL CITRATE 0.05 MG/ML IJ SOLN
INTRAMUSCULAR | Status: DC | PRN
  Administered 2013-07-22: 50 ug via INTRAVENOUS
  Administered 2013-07-22: 100 ug via INTRAVENOUS
  Administered 2013-07-22: 200 ug via INTRAVENOUS

## 2013-07-22 MED ORDER — LIDOCAINE-EPINEPHRINE (PF) 2 %-1:200000 IJ SOLN
INTRAMUSCULAR | Status: DC | PRN
  Administered 2013-07-22: 15 mL

## 2013-07-22 MED ORDER — LACTATED RINGERS IV SOLN: INTRAVENOUS | Status: DC

## 2013-07-22 MED ORDER — OXYCODONE HCL 5 MG PO TABS
5.0000 mg | ORAL_TABLET | ORAL | Status: DC | PRN
  Administered 2013-07-22: 5 mg via ORAL
  Filled 2013-07-22: qty 1

## 2013-07-22 MED ORDER — MEPERIDINE HCL 50 MG/ML IJ SOLN: 6.2500 mg | INTRAMUSCULAR | Status: DC | PRN

## 2013-07-22 MED ORDER — MIDAZOLAM HCL 5 MG/5ML IJ SOLN
INTRAMUSCULAR | Status: DC | PRN
  Administered 2013-07-22: 2 mg via INTRAVENOUS

## 2013-07-22 MED ORDER — LIDOCAINE-EPINEPHRINE 2 %-1:100000 IJ SOLN: INTRAMUSCULAR | Status: DC | PRN

## 2013-07-22 MED ORDER — CEFAZOLIN SODIUM-DEXTROSE 2-3 GM-% IV SOLR
INTRAVENOUS | Status: AC
  Filled 2013-07-22: qty 50

## 2013-07-22 MED ORDER — FENTANYL CITRATE 0.05 MG/ML IJ SOLN
25.0000 ug | INTRAMUSCULAR | Status: DC | PRN
  Administered 2013-07-22 (×4): 50 ug via INTRAVENOUS

## 2013-07-22 MED ORDER — DEXAMETHASONE SODIUM PHOSPHATE 10 MG/ML IJ SOLN
INTRAMUSCULAR | Status: DC | PRN
  Administered 2013-07-22: 10 mg via INTRAVENOUS

## 2013-07-22 MED ORDER — PROPOFOL 10 MG/ML IV BOLUS
INTRAVENOUS | Status: DC | PRN
  Administered 2013-07-22: 200 mg via INTRAVENOUS

## 2013-07-22 MED ORDER — LACTATED RINGERS IV SOLN
INTRAVENOUS | Status: DC
  Administered 2013-07-22: 1000 mL via INTRAVENOUS
  Administered 2013-07-22: 11:00:00 via INTRAVENOUS

## 2013-07-22 MED ORDER — PROMETHAZINE HCL 25 MG/ML IJ SOLN: 6.2500 mg | INTRAMUSCULAR | Status: DC | PRN

## 2013-07-22 MED ORDER — LABETALOL HCL 5 MG/ML IV SOLN
10.0000 mg | INTRAVENOUS | Status: AC | PRN
  Administered 2013-07-22 (×2): 10 mg via INTRAVENOUS

## 2013-07-22 SURGICAL SUPPLY — 25 items
ATTRACTOMAT 16X20 MAGNETIC DRP (DRAPES) ×2 IMPLANT
BAG ZIPLOCK 12X15 (MISCELLANEOUS) ×2 IMPLANT
BLADE SURG 15 STRL LF DISP TIS (BLADE) ×3 IMPLANT
BLADE SURG 15 STRL SS (BLADE) ×3
BUR CROSS CUT FISSURE 1.2 (BURR) ×2 IMPLANT
BUR EGG ELITE 4.0 (BURR) ×2 IMPLANT
CANNULA VESSEL W/WING WO/VALVE (CANNULA) ×2 IMPLANT
COVER SURGICAL LIGHT HANDLE (MISCELLANEOUS) ×2 IMPLANT
GAUZE SPONGE 4X4 16PLY XRAY LF (GAUZE/BANDAGES/DRESSINGS) ×2 IMPLANT
GLOVE SURG ORTHO 8.0 STRL STRW (GLOVE) ×2 IMPLANT
GLOVE SURG SS PI 6.5 STRL IVOR (GLOVE) ×2 IMPLANT
KIT BASIN OR (CUSTOM PROCEDURE TRAY) ×2 IMPLANT
NS IRRIG 1000ML POUR BTL (IV SOLUTION) ×2 IMPLANT
PACK EENT SPLIT (PACKS) ×2 IMPLANT
PACKING VAGINAL (PACKING) IMPLANT
PAD EYE OVAL STERILE LF (GAUZE/BANDAGES/DRESSINGS) ×4 IMPLANT
SPONGE GAUZE 4X4 12PLY (GAUZE/BANDAGES/DRESSINGS) ×2 IMPLANT
SUCTION FRAZIER 12FR DISP (SUCTIONS) ×2 IMPLANT
SUT CHROMIC 3 0 PS 2 (SUTURE) ×8 IMPLANT
SUT CHROMIC 4 0 P 3 18 (SUTURE) IMPLANT
SUT VIC AB 3-0 PS2 18 (SUTURE) ×2
SUT VIC AB 3-0 PS2 18XBRD (SUTURE) ×2 IMPLANT
SYR 50ML LL SCALE MARK (SYRINGE) ×2 IMPLANT
WATER STERILE IRR 1500ML POUR (IV SOLUTION) ×2 IMPLANT
YANKAUER SUCT BULB TIP 10FT TU (MISCELLANEOUS) ×2 IMPLANT

## 2013-07-22 NOTE — Anesthesia Postprocedure Evaluation (Signed)
  Anesthesia Post-op Note  Patient: [name]  Procedure(s) Performed: Procedure(s) (LRB): EXTRACTIONS WITH REMOVAL OF TORI AND REMOVAL OF EXOSTOSIS (N/A)  Patient Location: PACU  Anesthesia Type: General  Level of Consciousness: awake and alert   Airway and Oxygen Therapy: Patient Spontanous Breathing  Post-op Pain: mild  Post-op Assessment: Post-op Vital signs reviewed, Patient's Cardiovascular Status Stable, Respiratory Function Stable, Patent Airway and No signs of Nausea or vomiting  Last Vitals:  Filed Vitals:   07/22/13 1345  BP: 160/109  Pulse: 77  Temp:   Resp: 17    Post-op Vital Signs: stable   Complications: No apparent anesthesia complications

## 2013-07-22 NOTE — Anesthesia Procedure Notes (Signed)
Procedure Name: Intubation Date/Time: 07/22/2013 12:01 PM Performed by: [place], Bibi Economos C Pre-anesthesia Checklist: Patient identified, Emergency Drugs available, Patient being monitored and Timeout performed Patient Re-evaluated:Patient Re-evaluated prior to inductionOxygen Delivery Method: Circle system utilized Preoxygenation: Pre-oxygenation with 100% oxygen Intubation Type: IV induction Ventilation: Mask ventilation without difficulty and Nasal airway inserted- appropriate to patient size Laryngoscope Size: Mac and 4 Grade View: Grade I Nasal Tubes: Left and Nasal prep performed Tube size: 7.0 mm Number of attempts: 1 Placement Confirmation: ETT inserted through vocal cords under direct vision,  breath sounds checked- equal and bilateral,  positive ETCO2 and CO2 detector Secured at: 21 cm Tube secured with: Tape

## 2013-07-22 NOTE — Op Note (Signed)
07/22/2013  12:57 PM  PATIENT:  Wonda Amis  57 y.o. male  PRE-OPERATIVE DIAGNOSIS:  Non restorable teeth # 4, 18, 32, Bilateral mandibular lingual tori, Right maxillary exostosis  POST-OPERATIVE DIAGNOSIS:  SAME  PROCEDURE:  Procedure(s): EXTRACTIONS teeth # 4, 18, 32, REMOVAL OF Bilateral mandibular lingual tori,   REMOVAL OF EXOSTOSIS Right maxilla  SURGEON:  Surgeon(s): Georgia Lopes, DDS  ANESTHESIA:   local and general  EBL:  minimal  DRAINS: none   SPECIMEN:  No Specimen  COUNTS:  YES  PLAN OF CARE: Discharge to home after PACU  PATIENT DISPOSITION:  PACU - hemodynamically stable.   PROCEDURE DETAILS: Dictation #[id number]  Georgia Lopes, DMD 07/22/2013 12:57 PM

## 2013-07-22 NOTE — Transfer of Care (Signed)
Immediate Anesthesia Transfer of Care Note  Patient: [name]  Procedure(s) Performed: Procedure(s): EXTRACTIONS WITH REMOVAL OF TORI AND REMOVAL OF EXOSTOSIS (N/A)  Patient Location: PACU  Anesthesia Type:General  Level of Consciousness: awake and alert   Airway & Oxygen Therapy: Patient Spontanous Breathing and Patient connected to face mask oxygen  Post-op Assessment: Report given to PACU RN and Post -op Vital signs reviewed and stable  Post vital signs: Reviewed and stable  Complications: No apparent anesthesia complications

## 2013-07-22 NOTE — Anesthesia Preprocedure Evaluation (Signed)
Anesthesia Evaluation  Patient identified by MRN, date of birth, ID band Patient awake    Reviewed: Allergy & Precautions, H&P , NPO status , Patient's Chart, lab work & pertinent test results  Airway Mallampati: II TM Distance: >3 FB Neck ROM: Full    Dental no notable dental hx. (+) Poor Dentition   Pulmonary COPDCurrent Smoker,  breath sounds clear to auscultation  Pulmonary exam normal       Cardiovascular hypertension, Pt. on medications Rhythm:Regular Rate:Normal     Neuro/Psych negative neurological ROS  negative psych ROS   GI/Hepatic negative GI ROS, Neg liver ROS,   Endo/Other  negative endocrine ROS  Renal/GU negative Renal ROS  negative genitourinary   Musculoskeletal negative musculoskeletal ROS (+)   Abdominal   Peds negative pediatric ROS (+)  Hematology negative hematology ROS (+)   Anesthesia Other Findings   Reproductive/Obstetrics negative OB ROS                           Anesthesia Physical Anesthesia Plan  ASA: II  Anesthesia Plan: General   Post-op Pain Management:    Induction: Intravenous  Airway Management Planned: Nasal ETT  Additional Equipment:   Intra-op Plan:   Post-operative Plan: Extubation in OR  Informed Consent: I have reviewed the patients History and Physical, chart, labs and discussed the procedure including the risks, benefits and alternatives for the proposed anesthesia with the patient or authorized representative who has indicated his/her understanding and acceptance.   Dental advisory given  Plan Discussed with: CRNA  Anesthesia Plan Comments:         Anesthesia Quick Evaluation

## 2013-07-22 NOTE — H&P (Signed)
HISTORY AND PHYSICAL  [name] is a 57 y.o. male patient with [id number] teeth   No diagnosis found.  Past Medical History  Diagnosis Date  . Chronic back pain     S/P SPINAL FUSION - SOME INTERMITTEN NUMBNESS RT LEG AND CHRONIC PAIN AND STIFFNESS LUMBAR  . Hypertension     Current Facility-Administered Medications  Medication Dose Route Frequency Provider Last Rate Last Dose  . ceFAZolin (ANCEF) IVPB 2 g/50 mL premix  2 g Intravenous On Call to OR Georgia Lopes, DDS      . lactated ringers infusion   Intravenous Continuous Phillips Grout, MD 100 mL/hr at 07/22/13 0941 1,000 mL at 07/22/13 0941  . oxymetazoline (AFRIN) 0.05 % nasal spray 1 spray  1 spray Each Nare BID Phillips Grout, MD       Allergies  Allergen Reactions  . Vicodin [Hydrocodone-Acetaminophen] Nausea Only   Active Problems:   * No active hospital problems. *  Vitals: Blood pressure 144/101, pulse 83, temperature 97.1 F (36.2 C), temperature source Oral, resp. rate 20, SpO2 99.00%. Lab results: Results for orders placed during the hospital encounter of 07/21/13 (from the past 24 hour(s))  CBC     Status: None   Collection Time    07/21/13  3:25 PM      Result Value Range   WBC 6.1  4.0 - 10.5 K/uL   RBC 5.03  4.22 - 5.81 MIL/uL   Hemoglobin 15.4  13.0 - 17.0 g/dL   HCT 65.7  84.6 - 96.2 %   MCV 89.7  78.0 - 100.0 fL   MCH 30.6  26.0 - 34.0 pg   MCHC 34.1  30.0 - 36.0 g/dL   RDW 95.2  84.1 - 32.4 %   Platelets 256  150 - 400 K/uL  BASIC METABOLIC PANEL     Status: Abnormal   Collection Time    07/21/13  3:25 PM      Result Value Range   Sodium 140  135 - 145 mEq/L   Potassium 4.6  3.5 - 5.1 mEq/L   Chloride 104  96 - 112 mEq/L   CO2 24  19 - 32 mEq/L   Glucose, Bld 71  70 - 99 mg/dL   BUN 11  6 - 23 mg/dL   Creatinine, Ser 4.01  0.50 - 1.35 mg/dL   Calcium 02.7  8.4 - 25.3 mg/dL   GFR calc non Af Amer 90 (*) >90 mL/min   GFR calc Af Amer >90  >90 mL/min   Radiology Results: Dg Chest  2 View  07/21/2013   CLINICAL DATA:  Hypertension and history of tobacco use, preoperative evaluation  EXAM: CHEST  2 VIEW  COMPARISON:  10/31/2009  FINDINGS: Cardiac shadow is stable. The lungs are again hyperinflated. No focal infiltrate or sizable effusion is seen.  IMPRESSION: COPD without acute abnormality.   Electronically Signed   By: Alcide Clever M.D.   On: 07/21/2013 15:23   General appearance: alert, cooperative and no distress Head: Normocephalic, without obvious abnormality, atraumatic Eyes: negative Ears: normal TM's and external ear canals both ears Nose: Nares normal. Septum midline. Mucosa normal. No drainage or sinus tenderness. Throat: dental caries teeth #4, 18, 32, bilateral mandibular lingual tori, right maxillary exostosis Neck: no adenopathy, supple, symmetrical, trachea midline and thyroid not enlarged, symmetric, no tenderness/mass/nodules Resp: clear to auscultation bilaterally Cardio: regular rate and rhythm, S1, S2 normal, no murmur, click, rub or gallop  Assessment: Non-restorable teeth #'s  4, 18, 32; bilateral mandibular tori, right maxillary exostosis  Plan: Extract  teeth #'s 4, 18, 32; removal bilateral mandibular tori, removal right maxillary exostosis General anesthesia. Day surgery.   Georgia Lopes 07/22/2013

## 2013-07-23 NOTE — Op Note (Signed)
[name], [name] NO.:  [id number]  MEDICAL RECORD NO.:  [id number]  LOCATION:  [facility]                         FACILITY:  [facility]  PHYSICIAN:  [name], M.D.  DATE OF BIRTH:  1955-09-19  DATE OF PROCEDURE:  07/22/2013 DATE OF DISCHARGE:  07/22/2013                              OPERATIVE REPORT   PREOPERATIVE DIAGNOSES:  Nonrestorable teeth #4, 18, 32, bilateral mandibular lingual tori, right maxillary buccal exostosis.  POSTOPERATIVE DIAGNOSES:  Nonrestorable teeth #4, 18, 32, bilateral mandibular lingual tori, right maxillary buccal exostosis.  PROCEDURE:  Extraction of teeth #4, 18, 32, removal of bilateral mandibular lingual tori and removal of exostosis right maxilla.  SURGEON:  [name], M.D.  ANESTHESIA:  General nasal intubation.  PROCEDURE IN DETAIL:  The patient was taken to the operating room, placed on the table in supine position.  General anesthesia was administered intravenously and a nasal endotracheal tube was placed and secured.  The patient was draped for the procedure and time-out was performed.  Then, the posterior pharynx was suctioned.  A throat pack was placed.  A 2% lidocaine with 1:100,000 epinephrine was infiltrated in an inferior alveolar block on the right and left sides and a buccal and palatal infiltration around the maxillary teeth to be removed, as well as the right maxilla in the buccal and palatal region of the exostosis.  Then, a bite block was placed in the right side of the mouth and attention was turned to tooth #18 and a 15 blade was used to make an incision around this tooth, carrying anteriorly to the lingual surface of the anterior mandibular teeth, starting at #20 and carrying anteriorly to the midline.  The flap was reflected and the torus was identified after extensive reflection of the flap.  Then, 301 elevator and the lower forceps were used to remove tooth #18 atraumatically. Then, the torus  which approximately was 1.5 x 2.0 cm, was removed by using the Stryker handpiece fissure bur to create a demarcation separating the torus from the lingual border of the mandible.  This was carried down towards the inferior border and then the remainder was opened by using a Seldin retractor in the bony cut and gently wedging it until fractured.  Then, the torus was removed and the remaining bone was smoothed with a x-ray bur and bone file.  Then, the areas were irrigated and closed with 3-0 chromic.  The bite block was then repositioned in the side of the mouth.  Then, a 15 blade was used to make an incision along the alveolar crest beginning at tooth #6 carrying posteriorly to the tuberosity region.  The root of tooth #4 was removed using a rongeur and 301 elevator.  The periosteum was reflected to expose the buccal exostosis.  The exostosis was then trimmed with a egg-shaped bur and a Stryker handpiece and a bone file.  Then, the area was closed with 3-0 chromic.  In the mandible #32 was removed with the elevator and universal forceps and then the 15 blade was used to make an incision beginning in the midline lingual aspect of the mandible and carrying posteriorly to #32.  The periosteum was reflected  to expose the large right lingual mandibular tuberosity or torus.  Then, this was removed by using an osteotome to first squeeze the torus from the lingual border of the mandible, and then the torus was sectioned with a Stryker handpiece and removed from the mouth with the rongeur.  Then, the area was further smoothed with the egg-shaped bur and bone file.  Then, the right mandible was irrigated and sutured with 3-0 chromic.  The oral cavity was inspected and found to have good contour, hemostasis and closure. The oral cavity was irrigated and suctioned.  Throat pack was removed. The patient was awakened and taken to the recovery room, breathing spontaneously in good condition.  ESTIMATED  BLOOD LOSS:  Minimum.  COMPLICATIONS:  None.  SPECIMENS:  None.     [name], M.D.     SMJ/MEDQ  D:  07/22/2013  T:  07/23/2013  Job:  [id number]

## 2013-07-25 ENCOUNTER — Encounter (HOSPITAL_COMMUNITY): Payer: Self-pay | Admitting: Oral Surgery

## 2013-12-19 ENCOUNTER — Other Ambulatory Visit (HOSPITAL_COMMUNITY): Payer: Self-pay | Admitting: Cardiology

## 2013-12-19 DIAGNOSIS — R079 Chest pain, unspecified: Secondary | ICD-10-CM

## 2013-12-30 ENCOUNTER — Encounter (HOSPITAL_COMMUNITY): Payer: Medicaid Other

## 2013-12-30 ENCOUNTER — Ambulatory Visit (HOSPITAL_COMMUNITY): Payer: Medicaid Other

## 2014-01-06 ENCOUNTER — Encounter (HOSPITAL_COMMUNITY): Payer: Medicaid Other

## 2014-01-06 ENCOUNTER — Ambulatory Visit (HOSPITAL_COMMUNITY): Payer: Medicaid Other

## 2014-01-09 ENCOUNTER — Encounter (HOSPITAL_COMMUNITY)
Admission: RE | Admit: 2014-01-09 | Discharge: 2014-01-09 | Disposition: A | Discharge status: Home / Self Care | Payer: Medicaid Other | Source: Ambulatory Visit | Attending: Cardiology | Admitting: Cardiology

## 2014-01-09 ENCOUNTER — Other Ambulatory Visit: Payer: Self-pay

## 2014-01-09 DIAGNOSIS — R079 Chest pain, unspecified: Secondary | ICD-10-CM

## 2014-01-09 LAB — LIPID PANEL
CHOL/HDL RATIO: 3 ratio
CHOLESTEROL: 140 mg/dL (ref 0–200)
HDL: 47 mg/dL (ref >39)
LDL CALC: 72 mg/dL (ref 0–99)
TRIGLYCERIDES: 106 mg/dL (ref <150)
VLDL: 21 mg/dL (ref 0–40)

## 2014-01-09 LAB — HEPATIC FUNCTION PANEL
ALBUMIN: 3.7 g/dL (ref 3.5–5.2)
ALT: 22 U/L (ref 0–53)
AST: 30 U/L (ref 0–37)
Alkaline Phosphatase: 74 U/L (ref 39–117)
Total Bilirubin: 0.5 mg/dL (ref 0.3–1.2)
Total Protein: 7.3 g/dL (ref 6.0–8.3)

## 2014-01-09 LAB — BASIC METABOLIC PANEL
BUN: 8 mg/dL (ref 6–23)
CO2: 22 mEq/L (ref 19–32)
Calcium: 9.5 mg/dL (ref 8.4–10.5)
Chloride: 106 mEq/L (ref 96–112)
Creatinine, Ser: 0.95 mg/dL (ref 0.50–1.35)
Glucose, Bld: 91 mg/dL (ref 70–99)
POTASSIUM: 3.9 meq/L (ref 3.7–5.3)
SODIUM: 141 meq/L (ref 137–147)

## 2014-01-09 MED ORDER — TECHNETIUM TC 99M SESTAMIBI GENERIC - CARDIOLITE
10.0000 | Freq: Once | INTRAVENOUS | Status: AC | PRN
  Administered 2014-01-09: 10 via INTRAVENOUS

## 2014-01-09 MED ORDER — REGADENOSON 0.4 MG/5ML IV SOLN
0.4000 mg | Freq: Once | INTRAVENOUS | Status: AC
  Administered 2014-01-09: 0.4 mg via INTRAVENOUS

## 2014-01-09 MED ORDER — REGADENOSON 0.4 MG/5ML IV SOLN
INTRAVENOUS | Status: AC
  Filled 2014-01-09: qty 5

## 2014-01-09 MED ORDER — TECHNETIUM TC 99M SESTAMIBI GENERIC - CARDIOLITE
30.0000 | Freq: Once | INTRAVENOUS | Status: AC | PRN
  Administered 2014-01-09: 30 via INTRAVENOUS

## 2014-02-16 ENCOUNTER — Encounter (INDEPENDENT_AMBULATORY_CARE_PROVIDER_SITE_OTHER): Payer: Self-pay | Admitting: General Surgery

## 2014-02-16 ENCOUNTER — Ambulatory Visit (INDEPENDENT_AMBULATORY_CARE_PROVIDER_SITE_OTHER): Payer: Medicaid Other | Admitting: General Surgery

## 2014-02-16 ENCOUNTER — Other Ambulatory Visit (INDEPENDENT_AMBULATORY_CARE_PROVIDER_SITE_OTHER): Payer: Self-pay | Admitting: General Surgery

## 2014-02-16 VITALS — BP 118/80 | HR 77 | Temp 97.4°F | Resp 16 | Ht 72.0 in | Wt 184.0 lb

## 2014-02-16 DIAGNOSIS — D3A020 Benign carcinoid tumor of the appendix: Secondary | ICD-10-CM

## 2014-02-16 DIAGNOSIS — C181 Malignant neoplasm of appendix: Secondary | ICD-10-CM

## 2014-02-16 NOTE — Progress Notes (Signed)
Subjective:     Patient ID: [name], male   DOB: Nov 10, 1955, 58 y.o.   MRN: [id number]  HPI 58 year old African American male comes in because of complaints of a lump in his upper abdomen associated with some discomfort. I last saw him in August 2011. 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 He underwent laparoscopic assisted right hemicolectomy on February 2011. He was a no-show for his appointments in October in December of 2011. He states a few weeks ago he noticed a lump in his upper abdomen as well as some discomfort. He does report some nausea in the morning. He denies any diarrhea. He denies any flushing. He denies any weight loss. He reports a good appetite. He is unsure if he has a hernia.  PMHx, PSHx, SOCHx, FAMHx, ALL reviewed and unchanged  Review of Systems A 10 point Review of systems was performed and all systems are negative except for what is mentioned in the history of present illness     Objective:   Physical Exam BP 118/80  Pulse 77  Temp(Src) 97.4 F (36.3 C)  Resp 16  Ht 6' (1.829 m)  Wt 184 lb (83.462 kg)  BMI 24.95 kg/m2  Gen: alert, NAD, non-toxic appearing Pupils: equal, no scleral icterus Pulm: Lungs clear to auscultation, symmetric chest rise CV: regular rate and rhythm Abd: soft, nontender, nondistended. Well-healed trocar sites. No cellulitis. No incisional hernia Ext: no edema, Skin: no rash, no jaundice     Assessment:     Status post laparoscopic right hemicolectomy for T4 N1 ileal-appendiceal carcinoid tumor Upper abdominal discomfort     Plan:     I do not appreciate any ventral hernia. He was examined both standing and supine. He has perhaps a little bit of abdominal laxity in the area. He is long overdue for oncologic surveillance for his carcinoid tumor. We will set him up for CT scan of his abdomen and pelvis as well as biochemical surveillance. His followup will be based on the results of his  tests  Leighton Ruff. Redmond Pulling, MD, FACS General, Bariatric, & Minimally Invasive Surgery [facility], [place]

## 2014-02-21 ENCOUNTER — Other Ambulatory Visit: Payer: Medicaid Other

## 2014-02-22 ENCOUNTER — Other Ambulatory Visit (INDEPENDENT_AMBULATORY_CARE_PROVIDER_SITE_OTHER): Payer: Self-pay | Admitting: General Surgery

## 2014-02-22 ENCOUNTER — Other Ambulatory Visit: Payer: Medicaid Other

## 2014-02-22 ENCOUNTER — Ambulatory Visit
Admission: RE | Admit: 2014-02-22 | Discharge: 2014-02-22 | Disposition: A | Discharge status: Home / Self Care | Payer: Medicaid Other | Source: Ambulatory Visit | Attending: General Surgery | Admitting: General Surgery

## 2014-02-22 DIAGNOSIS — D3A020 Benign carcinoid tumor of the appendix: Secondary | ICD-10-CM

## 2014-02-22 MED ORDER — IOHEXOL 300 MG/ML  SOLN
100.0000 mL | Freq: Once | INTRAMUSCULAR | Status: AC | PRN
  Administered 2014-02-22: 100 mL via INTRAVENOUS

## 2014-02-24 ENCOUNTER — Telehealth (INDEPENDENT_AMBULATORY_CARE_PROVIDER_SITE_OTHER): Payer: Self-pay

## 2014-02-24 NOTE — Telephone Encounter (Signed)
Pt was calling to get results from CT scan. Informed pt stable postoperative changes. No acute abnormality is seen. The  overall appearance is similar to that seen on the prior exam. Pt verbalized understanding. Pt states that he will take his 24 hour urine to the lab.

## 2014-03-01 LAB — CHROMOGRANIN A: Chromogranin A: 5.6 ng/mL (ref 1.9–15.0)

## 2014-03-06 ENCOUNTER — Telehealth (INDEPENDENT_AMBULATORY_CARE_PROVIDER_SITE_OTHER): Payer: Self-pay | Admitting: General Surgery

## 2014-03-06 NOTE — Telephone Encounter (Signed)
Called patient to let him know that his lab work was normal and his Ct showed no sign of an hernia, he can follow up prn

## 2014-03-13 ENCOUNTER — Emergency Department (INDEPENDENT_AMBULATORY_CARE_PROVIDER_SITE_OTHER)
Admission: EM | Admit: 2014-03-13 | Discharge: 2014-03-13 | Disposition: A | Discharge status: Home / Self Care | Payer: Medicaid Other | Source: Home / Self Care | Attending: Family Medicine | Admitting: Family Medicine

## 2014-03-13 DIAGNOSIS — T465X5A Adverse effect of other antihypertensive drugs, initial encounter: Secondary | ICD-10-CM

## 2014-03-13 DIAGNOSIS — T783XXA Angioneurotic edema, initial encounter: Secondary | ICD-10-CM

## 2014-03-13 DIAGNOSIS — T464X5A Adverse effect of angiotensin-converting-enzyme inhibitors, initial encounter: Secondary | ICD-10-CM

## 2014-03-13 MED ORDER — DIPHENHYDRAMINE HCL 50 MG/ML IJ SOLN
50.0000 mg | Freq: Once | INTRAMUSCULAR | Status: AC
  Administered 2014-03-13: 50 mg via INTRAMUSCULAR

## 2014-03-13 MED ORDER — DIPHENHYDRAMINE HCL 50 MG/ML IJ SOLN
INTRAMUSCULAR | Status: AC
  Filled 2014-03-13: qty 1

## 2014-03-13 NOTE — ED Provider Notes (Signed)
CSN: [id number]     Arrival date & time 03/13/14  1719 History   First MD Initiated Contact with Patient 03/13/14 1733     No chief complaint on file.  (Consider location/radiation/quality/duration/timing/severity/associated sxs/prior Treatment) Patient is a 58 y.o. male presenting with mouth sores. The history is provided by the patient.  Mouth Lesions Location:  Upper lip and lower lip Onset quality:  Sudden Severity:  Moderate Progression:  Worsening (mild lip swelling last eve.) Chronicity:  Recurrent Context comment:  Has occurred 1 time prev, onset last eve, continued today after took bp med. Relieved by:  None tried Worsened by:  Nothing tried Ineffective treatments:  None tried Associated symptoms: no rhinorrhea, no sore throat and no swollen glands     Past Medical History  Diagnosis Date  . Chronic back pain     S/P SPINAL FUSION - SOME INTERMITTEN NUMBNESS RT LEG AND CHRONIC PAIN AND STIFFNESS LUMBAR  . Hypertension    Past Surgical History  Procedure Laterality Date  . Back surgery      X 2 INCLUDING FUSION OF SPINE  . Colon surgery  2011    PARTIAL COLON RESECTION FOR CANCER  . Multiple extractions with alveoloplasty N/A 07/22/2013    Procedure: EXTRACTIONS WITH REMOVAL OF TORI AND REMOVAL OF EXOSTOSIS;  Surgeon: [name], DDS;  Location: [facility];  Service: Oral Surgery;  Laterality: N/A;  . Wisdom tooth extraction     No family history on file. History  Substance Use Topics  . Smoking status: Current Every Day Smoker -- 0.25 packs/day for 30 years    Types: Cigarettes  . Smokeless tobacco: Never Used  . Alcohol Use: Yes     Comment: occasional    Review of Systems  Constitutional: Negative.   HENT: Positive for mouth sores. Negative for rhinorrhea, sore throat and trouble swallowing.   Respiratory: Negative for shortness of breath and wheezing.     Allergies  Vicodin  Home Medications   Prior to Admission medications   Medication Sig Start  Date End Date Taking? Authorizing Provider  amLODipine (NORVASC) 10 MG tablet Take 10 mg by mouth every morning.     Historical Provider, MD  benazepril (LOTENSIN) 20 MG tablet Take 20 mg by mouth 2 (two) times daily.     Historical Provider, MD  Choline Fenofibrate (TRILIPIX) 135 MG capsule Take 135 mg by mouth every morning.     Historical Provider, MD  Multiple Vitamin (MULTIVITAMIN WITH MINERALS) TABS tablet Take 1 tablet by mouth daily.    Historical Provider, MD  oxyCODONE-acetaminophen (PERCOCET) 10-325 MG per tablet Take 1-2 tablets by mouth every 4 (four) hours as needed for pain. 07/22/13   [name], DDS   BP 121/84  Pulse 87  Temp(Src) 98.3 F (36.8 C) (Oral)  Resp 16  SpO2 96% Physical Exam  Nursing note and vitals reviewed. Constitutional: He is oriented to person, place, and time. He appears well-developed and well-nourished.  HENT:  Mouth/Throat: Oropharynx is clear and moist.  Upper lip edema sts, tongue nl post pharynx wnl., no resp difficulty.  Eyes: Conjunctivae are normal. Pupils are equal, round, and reactive to light.  Neck: Normal range of motion. Neck supple.  Pulmonary/Chest: Effort normal and breath sounds normal. No respiratory distress. He has no wheezes. He has no rales.  Neurological: He is alert and oriented to person, place, and time.  Skin: Skin is warm and dry.    ED Course  Procedures (including critical care  time) Labs Review Labs Reviewed - No data to display  Imaging Review No results found.   MDM   1. ACE inhibitor-aggravated angioedema, initial encounter        Billy Fischer, MD 03/13/14 1755

## 2014-03-13 NOTE — Discharge Instructions (Signed)
No more benazepril bp medicine, see your doctor for change of medicine.

## 2014-05-02 ENCOUNTER — Emergency Department (HOSPITAL_COMMUNITY): Payer: Medicaid Other

## 2014-05-02 ENCOUNTER — Encounter (HOSPITAL_COMMUNITY): Payer: Self-pay | Admitting: Emergency Medicine

## 2014-05-02 ENCOUNTER — Encounter (HOSPITAL_COMMUNITY)
Admission: EM | Disposition: A | Discharge status: Home / Self Care | Payer: Self-pay | Source: Home / Self Care | Attending: Internal Medicine

## 2014-05-02 ENCOUNTER — Encounter (HOSPITAL_COMMUNITY): Payer: Medicaid Other | Admitting: Anesthesiology

## 2014-05-02 ENCOUNTER — Inpatient Hospital Stay (HOSPITAL_COMMUNITY)
Admission: EM | Admit: 2014-05-02 | Discharge: 2014-05-04 | DRG: 603 | Disposition: A | Discharge status: Home / Self Care | Payer: Medicaid Other | Attending: Internal Medicine | Admitting: Internal Medicine

## 2014-05-02 ENCOUNTER — Inpatient Hospital Stay (HOSPITAL_COMMUNITY): Payer: Medicaid Other | Admitting: Anesthesiology

## 2014-05-02 DIAGNOSIS — Z720 Tobacco use: Secondary | ICD-10-CM

## 2014-05-02 DIAGNOSIS — Z85038 Personal history of other malignant neoplasm of large intestine: Secondary | ICD-10-CM | POA: Diagnosis not present

## 2014-05-02 DIAGNOSIS — L0201 Cutaneous abscess of face: Secondary | ICD-10-CM | POA: Diagnosis present

## 2014-05-02 DIAGNOSIS — Y849 Medical procedure, unspecified as the cause of abnormal reaction of the patient, or of later complication, without mention of misadventure at the time of the procedure: Secondary | ICD-10-CM | POA: Diagnosis present

## 2014-05-02 DIAGNOSIS — G8929 Other chronic pain: Secondary | ICD-10-CM | POA: Diagnosis present

## 2014-05-02 DIAGNOSIS — L0390 Cellulitis, unspecified: Secondary | ICD-10-CM

## 2014-05-02 DIAGNOSIS — L03211 Cellulitis of face: Secondary | ICD-10-CM | POA: Diagnosis not present

## 2014-05-02 DIAGNOSIS — Y9210 Unspecified residential institution as the place of occurrence of the external cause: Secondary | ICD-10-CM | POA: Diagnosis present

## 2014-05-02 DIAGNOSIS — E871 Hypo-osmolality and hyponatremia: Secondary | ICD-10-CM | POA: Diagnosis present

## 2014-05-02 DIAGNOSIS — E861 Hypovolemia: Secondary | ICD-10-CM | POA: Diagnosis present

## 2014-05-02 DIAGNOSIS — E785 Hyperlipidemia, unspecified: Secondary | ICD-10-CM | POA: Diagnosis present

## 2014-05-02 DIAGNOSIS — R000 Tachycardia, unspecified: Secondary | ICD-10-CM | POA: Diagnosis present

## 2014-05-02 DIAGNOSIS — Z9889 Other specified postprocedural states: Secondary | ICD-10-CM

## 2014-05-02 DIAGNOSIS — T380X5A Adverse effect of glucocorticoids and synthetic analogues, initial encounter: Secondary | ICD-10-CM | POA: Diagnosis present

## 2014-05-02 DIAGNOSIS — R7309 Other abnormal glucose: Secondary | ICD-10-CM | POA: Diagnosis present

## 2014-05-02 DIAGNOSIS — Z9049 Acquired absence of other specified parts of digestive tract: Secondary | ICD-10-CM | POA: Diagnosis not present

## 2014-05-02 DIAGNOSIS — F17200 Nicotine dependence, unspecified, uncomplicated: Secondary | ICD-10-CM | POA: Diagnosis present

## 2014-05-02 DIAGNOSIS — IMO0002 Reserved for concepts with insufficient information to code with codable children: Secondary | ICD-10-CM | POA: Diagnosis present

## 2014-05-02 DIAGNOSIS — Z981 Arthrodesis status: Secondary | ICD-10-CM | POA: Diagnosis not present

## 2014-05-02 DIAGNOSIS — I10 Essential (primary) hypertension: Secondary | ICD-10-CM | POA: Diagnosis present

## 2014-05-02 DIAGNOSIS — L0291 Cutaneous abscess, unspecified: Secondary | ICD-10-CM | POA: Diagnosis present

## 2014-05-02 DIAGNOSIS — K122 Cellulitis and abscess of mouth: Secondary | ICD-10-CM | POA: Diagnosis present

## 2014-05-02 HISTORY — PX: TONSILLECTOMY AND ADENOIDECTOMY: SHX28

## 2014-05-02 LAB — COMPREHENSIVE METABOLIC PANEL
ALT: 15 U/L (ref 0–53)
AST: 22 U/L (ref 0–37)
Albumin: 4 g/dL (ref 3.5–5.2)
Alkaline Phosphatase: 68 U/L (ref 39–117)
Anion gap: 18 — ABNORMAL HIGH (ref 5–15)
BUN: 15 mg/dL (ref 6–23)
CO2: 22 meq/L (ref 19–32)
Calcium: 10.5 mg/dL (ref 8.4–10.5)
Chloride: 97 mEq/L (ref 96–112)
Creatinine, Ser: 0.97 mg/dL (ref 0.50–1.35)
GFR calc non Af Amer: 90 mL/min — ABNORMAL LOW (ref >90)
GLUCOSE: 107 mg/dL — AB (ref 70–99)
Potassium: 4.4 mEq/L (ref 3.7–5.3)
SODIUM: 137 meq/L (ref 137–147)
TOTAL PROTEIN: 9.5 g/dL — AB (ref 6.0–8.3)
Total Bilirubin: 0.4 mg/dL (ref 0.3–1.2)

## 2014-05-02 LAB — CBC WITH DIFFERENTIAL/PLATELET
Basophils Absolute: 0 10*3/uL (ref 0.0–0.1)
Basophils Relative: 0 % (ref 0–1)
EOS ABS: 0.1 10*3/uL (ref 0.0–0.7)
Eosinophils Relative: 1 % (ref 0–5)
HCT: 48.6 % (ref 39.0–52.0)
Hemoglobin: 16.8 g/dL (ref 13.0–17.0)
LYMPHS ABS: 2.3 10*3/uL (ref 0.7–4.0)
LYMPHS PCT: 34 % (ref 12–46)
MCH: 32.2 pg (ref 26.0–34.0)
MCHC: 34.6 g/dL (ref 30.0–36.0)
MCV: 93.3 fL (ref 78.0–100.0)
Monocytes Absolute: 0.8 10*3/uL (ref 0.1–1.0)
Monocytes Relative: 12 % (ref 3–12)
NEUTROS PCT: 53 % (ref 43–77)
Neutro Abs: 3.6 10*3/uL (ref 1.7–7.7)
Platelets: 321 10*3/uL (ref 150–400)
RBC: 5.21 MIL/uL (ref 4.22–5.81)
RDW: 12.5 % (ref 11.5–15.5)
WBC: 6.8 10*3/uL (ref 4.0–10.5)

## 2014-05-02 LAB — CBC
HEMATOCRIT: 42.6 % (ref 39.0–52.0)
HEMOGLOBIN: 15 g/dL (ref 13.0–17.0)
MCH: 32.1 pg (ref 26.0–34.0)
MCHC: 35.2 g/dL (ref 30.0–36.0)
MCV: 91 fL (ref 78.0–100.0)
PLATELETS: 290 10*3/uL (ref 150–400)
RBC: 4.68 MIL/uL (ref 4.22–5.81)
RDW: 12.4 % (ref 11.5–15.5)
WBC: 6.7 10*3/uL (ref 4.0–10.5)

## 2014-05-02 LAB — CREATININE, SERUM
Creatinine, Ser: 0.77 mg/dL (ref 0.50–1.35)
GFR calc non Af Amer: >90 mL/min (ref >90)

## 2014-05-02 SURGERY — TONSILLECTOMY AND ADENOIDECTOMY
Anesthesia: General | Site: Mouth | Laterality: Left

## 2014-05-02 MED ORDER — PROPOFOL 10 MG/ML IV BOLUS
INTRAVENOUS | Status: DC | PRN
  Administered 2014-05-02: 150 mg via INTRAVENOUS

## 2014-05-02 MED ORDER — FENOFIBRATE 160 MG PO TABS
160.0000 mg | ORAL_TABLET | Freq: Every day | ORAL | Status: DC
  Administered 2014-05-02 – 2014-05-04 (×3): 160 mg via ORAL
  Filled 2014-05-02 (×3): qty 1

## 2014-05-02 MED ORDER — CARVEDILOL 3.125 MG PO TABS
3.1250 mg | ORAL_TABLET | Freq: Two times a day (BID) | ORAL | Status: DC
  Administered 2014-05-02 – 2014-05-04 (×4): 3.125 mg via ORAL
  Filled 2014-05-02 (×6): qty 1

## 2014-05-02 MED ORDER — HYDROMORPHONE HCL PF 1 MG/ML IJ SOLN
0.5000 mg | Freq: Once | INTRAMUSCULAR | Status: AC
  Administered 2014-05-02: 0.5 mg via INTRAVENOUS
  Filled 2014-05-02: qty 1

## 2014-05-02 MED ORDER — DEXAMETHASONE SODIUM PHOSPHATE 10 MG/ML IJ SOLN
10.0000 mg | Freq: Three times a day (TID) | INTRAMUSCULAR | Status: AC
  Administered 2014-05-02 – 2014-05-03 (×3): 10 mg via INTRAVENOUS
  Filled 2014-05-02 (×6): qty 1

## 2014-05-02 MED ORDER — IBUPROFEN 100 MG/5ML PO SUSP
400.0000 mg | Freq: Four times a day (QID) | ORAL | Status: DC | PRN
  Filled 2014-05-02: qty 20

## 2014-05-02 MED ORDER — MIDAZOLAM HCL 2 MG/2ML IJ SOLN
INTRAMUSCULAR | Status: AC
  Filled 2014-05-02: qty 2

## 2014-05-02 MED ORDER — LACTATED RINGERS IV SOLN
INTRAVENOUS | Status: DC | PRN
  Administered 2014-05-02: 19:00:00 via INTRAVENOUS

## 2014-05-02 MED ORDER — CLINDAMYCIN PHOSPHATE 600 MG/50ML IV SOLN
600.0000 mg | Freq: Three times a day (TID) | INTRAVENOUS | Status: DC
  Administered 2014-05-02: 600 mg via INTRAVENOUS
  Filled 2014-05-02: qty 50

## 2014-05-02 MED ORDER — LIDOCAINE HCL (CARDIAC) 20 MG/ML IV SOLN
INTRAVENOUS | Status: DC | PRN
  Administered 2014-05-02: 100 mg via INTRAVENOUS

## 2014-05-02 MED ORDER — DEXAMETHASONE SODIUM PHOSPHATE 10 MG/ML IJ SOLN
INTRAMUSCULAR | Status: DC | PRN
  Administered 2014-05-02: 10 mg via INTRAVENOUS

## 2014-05-02 MED ORDER — MIDAZOLAM HCL 5 MG/5ML IJ SOLN
INTRAMUSCULAR | Status: DC | PRN
  Administered 2014-05-02: 2 mg via INTRAVENOUS

## 2014-05-02 MED ORDER — ONDANSETRON HCL 4 MG/2ML IJ SOLN: 4.0000 mg | Freq: Four times a day (QID) | INTRAMUSCULAR | Status: DC | PRN

## 2014-05-02 MED ORDER — ONDANSETRON HCL 4 MG PO TABS: 4.0000 mg | ORAL_TABLET | Freq: Four times a day (QID) | ORAL | Status: DC | PRN

## 2014-05-02 MED ORDER — SUCCINYLCHOLINE CHLORIDE 20 MG/ML IJ SOLN
INTRAMUSCULAR | Status: DC | PRN
  Administered 2014-05-02: 140 mg via INTRAVENOUS

## 2014-05-02 MED ORDER — MORPHINE SULFATE 2 MG/ML IJ SOLN
INTRAMUSCULAR | Status: AC
  Filled 2014-05-02: qty 1

## 2014-05-02 MED ORDER — ACETAMINOPHEN 325 MG PO TABS
650.0000 mg | ORAL_TABLET | Freq: Four times a day (QID) | ORAL | Status: DC | PRN
  Administered 2014-05-03: 650 mg via ORAL
  Filled 2014-05-02: qty 2

## 2014-05-02 MED ORDER — FENTANYL CITRATE 0.05 MG/ML IJ SOLN
INTRAMUSCULAR | Status: AC
  Filled 2014-05-02: qty 5

## 2014-05-02 MED ORDER — ACETAMINOPHEN 650 MG RE SUPP: 650.0000 mg | Freq: Four times a day (QID) | RECTAL | Status: DC | PRN

## 2014-05-02 MED ORDER — AMLODIPINE BESYLATE 10 MG PO TABS
10.0000 mg | ORAL_TABLET | Freq: Every morning | ORAL | Status: DC
  Administered 2014-05-03 – 2014-05-04 (×2): 10 mg via ORAL
  Filled 2014-05-02 (×2): qty 1

## 2014-05-02 MED ORDER — HYDROMORPHONE HCL PF 1 MG/ML IJ SOLN
1.0000 mg | INTRAMUSCULAR | Status: DC | PRN
  Administered 2014-05-02 – 2014-05-03 (×3): 1 mg via INTRAVENOUS
  Filled 2014-05-02 (×3): qty 1

## 2014-05-02 MED ORDER — HYDRALAZINE HCL 20 MG/ML IJ SOLN
INTRAMUSCULAR | Status: AC
  Filled 2014-05-02: qty 1

## 2014-05-02 MED ORDER — CHLORHEXIDINE GLUCONATE 0.12 % MT SOLN
15.0000 mL | Freq: Four times a day (QID) | OROMUCOSAL | Status: DC
  Administered 2014-05-02 – 2014-05-04 (×5): 15 mL via OROMUCOSAL
  Filled 2014-05-02 (×10): qty 15

## 2014-05-02 MED ORDER — MORPHINE SULFATE 4 MG/ML IJ SOLN
4.0000 mg | Freq: Once | INTRAMUSCULAR | Status: AC
  Administered 2014-05-02: 4 mg via INTRAVENOUS
  Filled 2014-05-02: qty 1

## 2014-05-02 MED ORDER — MORPHINE SULFATE 2 MG/ML IJ SOLN
2.0000 mg | INTRAMUSCULAR | Status: DC | PRN
  Administered 2014-05-02: 2 mg via INTRAVENOUS

## 2014-05-02 MED ORDER — AMINOCAPROIC ACID SOLUTION 5% (50 MG/ML)
5.0000 mL | ORAL | Status: DC | PRN
  Filled 2014-05-02: qty 100

## 2014-05-02 MED ORDER — HYDRALAZINE HCL 20 MG/ML IJ SOLN
5.0000 mg | INTRAMUSCULAR | Status: DC | PRN
  Administered 2014-05-02: 5 mg via INTRAVENOUS

## 2014-05-02 MED ORDER — 0.9 % SODIUM CHLORIDE (POUR BTL) OPTIME
TOPICAL | Status: DC | PRN
  Administered 2014-05-02: 1000 mL

## 2014-05-02 MED ORDER — HEPARIN SODIUM (PORCINE) 5000 UNIT/ML IJ SOLN
5000.0000 [IU] | Freq: Three times a day (TID) | INTRAMUSCULAR | Status: DC
  Administered 2014-05-02 – 2014-05-04 (×5): 5000 [IU] via SUBCUTANEOUS
  Filled 2014-05-02 (×9): qty 1

## 2014-05-02 MED ORDER — SODIUM CHLORIDE 0.9 % IV SOLN
1.5000 g | Freq: Four times a day (QID) | INTRAVENOUS | Status: DC
  Administered 2014-05-02 – 2014-05-03 (×4): 1.5 g via INTRAVENOUS
  Filled 2014-05-02 (×6): qty 1.5

## 2014-05-02 MED ORDER — ONDANSETRON HCL 4 MG/2ML IJ SOLN
INTRAMUSCULAR | Status: DC | PRN
  Administered 2014-05-02: 4 mg via INTRAVENOUS

## 2014-05-02 MED ORDER — FENTANYL CITRATE 0.05 MG/ML IJ SOLN
INTRAMUSCULAR | Status: DC | PRN
  Administered 2014-05-02: 100 ug via INTRAVENOUS
  Administered 2014-05-02: 50 ug via INTRAVENOUS
  Administered 2014-05-02: 100 ug via INTRAVENOUS

## 2014-05-02 MED ORDER — ONDANSETRON HCL 4 MG/2ML IJ SOLN
4.0000 mg | Freq: Once | INTRAMUSCULAR | Status: AC
  Administered 2014-05-02: 4 mg via INTRAVENOUS
  Filled 2014-05-02: qty 2

## 2014-05-02 MED ORDER — ACETAMINOPHEN 160 MG/5ML PO SOLN
650.0000 mg | Freq: Four times a day (QID) | ORAL | Status: DC | PRN
  Administered 2014-05-02: 650 mg via ORAL
  Filled 2014-05-02 (×2): qty 20.3

## 2014-05-02 MED ORDER — IOHEXOL 300 MG/ML  SOLN
80.0000 mL | Freq: Once | INTRAMUSCULAR | Status: AC | PRN
  Administered 2014-05-02: 80 mL via INTRAVENOUS

## 2014-05-02 MED ORDER — PROPOFOL 10 MG/ML IV BOLUS
INTRAVENOUS | Status: AC
  Filled 2014-05-02: qty 20

## 2014-05-02 SURGICAL SUPPLY — 33 items
CANISTER SUCTION 2500CC (MISCELLANEOUS) ×3 IMPLANT
CATH ROBINSON RED A/P 10FR (CATHETERS) IMPLANT
CLEANER TIP ELECTROSURG 2X2 (MISCELLANEOUS) ×3 IMPLANT
COAGULATOR SUCT 6 FR SWTCH (ELECTROSURGICAL) ×1
COAGULATOR SUCT SWTCH 10FR 6 (ELECTROSURGICAL) ×2 IMPLANT
ELECT COATED BLADE 2.86 ST (ELECTRODE) ×3 IMPLANT
ELECT REM PT RETURN 9FT ADLT (ELECTROSURGICAL) ×3
ELECT REM PT RETURN 9FT PED (ELECTROSURGICAL)
ELECTRODE REM PT RETRN 9FT PED (ELECTROSURGICAL) IMPLANT
ELECTRODE REM PT RTRN 9FT ADLT (ELECTROSURGICAL) ×1 IMPLANT
GAUZE SPONGE 4X4 16PLY XRAY LF (GAUZE/BANDAGES/DRESSINGS) ×3 IMPLANT
GLOVE BIOGEL PI IND STRL 7.5 (GLOVE) ×1 IMPLANT
GLOVE BIOGEL PI INDICATOR 7.5 (GLOVE) ×2
GLOVE SURG SS PI 7.5 STRL IVOR (GLOVE) ×6 IMPLANT
GOWN STRL REUS W/ TWL LRG LVL3 (GOWN DISPOSABLE) ×2 IMPLANT
GOWN STRL REUS W/TWL LRG LVL3 (GOWN DISPOSABLE) ×4
KIT BASIN OR (CUSTOM PROCEDURE TRAY) ×3 IMPLANT
KIT ROOM TURNOVER OR (KITS) ×3 IMPLANT
NEEDLE 18GX1X1/2 (RX/OR ONLY) (NEEDLE) ×3 IMPLANT
NS IRRIG 1000ML POUR BTL (IV SOLUTION) ×3 IMPLANT
PACK SURGICAL SETUP 50X90 (CUSTOM PROCEDURE TRAY) ×3 IMPLANT
PAD ARMBOARD 7.5X6 YLW CONV (MISCELLANEOUS) ×6 IMPLANT
PENCIL BUTTON HOLSTER BLD 10FT (ELECTRODE) ×3 IMPLANT
SPONGE TONSIL 1 RF SGL (DISPOSABLE) IMPLANT
SWAB COLLECTION DEVICE MRSA (MISCELLANEOUS) ×3 IMPLANT
SYR BULB 3OZ (MISCELLANEOUS) ×3 IMPLANT
SYR CONTROL 10ML LL (SYRINGE) ×3 IMPLANT
TOWEL OR 17X26 10 PK STRL BLUE (TOWEL DISPOSABLE) ×3 IMPLANT
TUBE ANAEROBIC SPECIMEN COL (MISCELLANEOUS) ×3 IMPLANT
TUBE CONNECTING 12'X1/4 (SUCTIONS) ×1
TUBE CONNECTING 12X1/4 (SUCTIONS) ×2 IMPLANT
TUBE SALEM SUMP 12R W/ARV (TUBING) ×3 IMPLANT
YANKAUER SUCT BULB TIP NO VENT (SUCTIONS) ×3 IMPLANT

## 2014-05-02 NOTE — Progress Notes (Signed)
Pt has arrived from PACU. Report received from [place], RN at bedside.

## 2014-05-02 NOTE — H&P (Signed)
[facility] History and Physical  [name] VFI:[id number] DOB: 02-May-1956 DOA: 05/02/2014  Referring physician: Emergency Department PCP: Ricke Hey, MD  Specialists:   Chief Complaint: Jaw/neck pain  HPI: [name] is a 58 y.o. male  With a hx of chronic back pain and htn who presents to the ED with worsening B jaw and neck pain/swelling. Pt is s/p recent jaw surgery one week prior on 8/19 described as "shaving some of my jaw" in order to accommodate dentures. Pt describes difficulty swallowing solid foods, bad breath, and a "weird taste" in the mouth. In the ED, pt noted to have L sublingual space abscess on CT neck. ENT consulted through the ED and hospitalist consulted for admission.  Review of Systems:  Per above, the remainder of the 10pt ros reviewed and are neg  Past Medical History  Diagnosis Date  . Chronic back pain     S/P SPINAL FUSION - SOME INTERMITTEN NUMBNESS RT LEG AND CHRONIC PAIN AND STIFFNESS LUMBAR  . Hypertension    Past Surgical History  Procedure Laterality Date  . Back surgery      X 2 INCLUDING FUSION OF SPINE  . Colon surgery  2011    PARTIAL COLON RESECTION FOR CANCER  . Multiple extractions with alveoloplasty N/A 07/22/2013    Procedure: EXTRACTIONS WITH REMOVAL OF TORI AND REMOVAL OF EXOSTOSIS;  Surgeon: [name], DDS;  Location: [facility];  Service: Oral Surgery;  Laterality: N/A;  . Wisdom tooth extraction     Social History:  reports that he has been smoking Cigarettes.  He has a 7.5 pack-year smoking history. He has never used smokeless tobacco. He reports that he drinks alcohol. He reports that he does not use illicit drugs.  where does patient live--home, ALF, SNF? and with whom if at home?  Can patient participate in ADLs?  Allergies  Allergen Reactions  . Vicodin [Hydrocodone-Acetaminophen] Nausea Only    No family history on file. reviewed and is noncontributory to this case (be sure to  complete)  Prior to Admission medications   Medication Sig Start Date End Date Taking? Authorizing Provider  amLODipine (NORVASC) 10 MG tablet Take 10 mg by mouth every morning.    Yes Historical Provider, MD  amLODipine (NORVASC) 5 MG tablet Take 5 mg by mouth daily.   Yes Historical Provider, MD  carvedilol (COREG) 3.125 MG tablet Take 3.125 mg by mouth 2 (two) times daily with a meal.   Yes Historical Provider, MD  Choline Fenofibrate (TRILIPIX) 135 MG capsule Take 135 mg by mouth every morning.    Yes Historical Provider, MD  oxyCODONE (ROXICODONE) 15 MG immediate release tablet Take 15 mg by mouth every 4 (four) hours as needed for pain.   Yes Historical Provider, MD   Physical Exam: Filed Vitals:   05/02/14 1430 05/02/14 1500 05/02/14 1615 05/02/14 1700  BP: 131/97 135/96 115/72 135/110  Pulse: 96 94 86 90  Temp:      TempSrc:      Resp:      Weight:      SpO2: 96% 99% 95% 97%     General:  Awake, in nad  Eyes: PERRL B  ENT: membranes moist, dentition fair, B swelling of lower jaw w/ purulence  Neck: trachea midline, neck supple  Cardiovascular: regular, s1, s2  Respiratory: normal resp effort, no wheezing  Abdomen: soft,nondistended  Skin: normal skin turgor, no abnormal skin lesions seen  Musculoskeletal: perfused, no clubbing  Psychiatric: mood/affect  normal// no auditory/visual hallucinations  Neurologic: cn2-12 grossly intact/strength/sensation intact  Labs on Admission:  Basic Metabolic Panel:  Recent Labs Lab 05/02/14 1238  NA 137  K 4.4  CL 97  CO2 22  GLUCOSE 107*  BUN 15  CREATININE 0.97  CALCIUM 10.5   Liver Function Tests:  Recent Labs Lab 05/02/14 1238  AST 22  ALT 15  ALKPHOS 68  BILITOT 0.4  PROT 9.5*  ALBUMIN 4.0   No results found for this basename: LIPASE, AMYLASE,  in the last 168 hours No results found for this basename: AMMONIA,  in the last 168 hours CBC:  Recent Labs Lab 05/02/14 1238  WBC 6.8  NEUTROABS 3.6   HGB 16.8  HCT 48.6  MCV 93.3  PLT 321   Cardiac Enzymes: No results found for this basename: CKTOTAL, CKMB, CKMBINDEX, TROPONINI,  in the last 168 hours  BNP (last 3 results) No results found for this basename: PROBNP,  in the last 8760 hours CBG: No results found for this basename: GLUCAP,  in the last 168 hours  Radiological Exams on Admission: Ct Soft Tissue Neck W Contrast  05/02/2014   CLINICAL DATA:  Neck pain.  Mouth pain after oral surgery.  EXAM: CT NECK WITH CONTRAST  TECHNIQUE: Multidetector CT imaging of the neck was performed using the standard protocol following the bolus administration of intravenous contrast.  CONTRAST:  65mL OMNIPAQUE IOHEXOL 300 MG/ML  SOLN  COMPARISON:  None.  FINDINGS: The patient appears to have had a prior surgical procedure for mandibular tori. There are no surgical reports of this however although by history from the patient this procedure was performed so that dentures would fit better.  There is a LEFT sublingual space abscess on the LEFT which is pleomorphic in extent, greater posteriorly than anteriorly, but extending toward the midline. Cross-sectional measurements of the enhancing, well-defined abscess are approximately 10 x 35 x 20 mm. There is hyperdense material closely applied to the RIGHT mandible, and medial to the abscess cavity, which could represent residual tori.  There is ill-defined edema extending across the midline below the mylohyoid muscle. Although none is present at this time, the potential exists for an oral cavity or root of tongue abscess, and surgical consultation is warranted to avert worsening which might lead to displacement of the tongue posteriorly.  There is reactive level I and II adenopathy. The LEFT submandibular gland appears mildly inflamed. There is carotid atherosclerosis. Subcentimeter sized areas of hypoattenuation in the LEFT lobe of the thyroid likely representing incidental cysts.  There is a central area of  hypoattenuation within the LEFT jugular vein which could represent thrombus (image 65 series 3). Alternatively this could be flow related artifact although the LEFT jugular vein is larger than the RIGHT. COPD is present with multiple air cysts. There is a ill-defined subcentimeter nodule at the LEFT lung apex (image 23 series 3) which in the setting of suspected jugular vein thrombosis, could represent a septic embolus; CT chest with contrast could be helpful in further assessment to look for other possible emboli.  Cervical spondylosis. Negative intracranial compartment. Negative orbits. Retention cyst formation RIGHT maxillary sinus.  IMPRESSION: LEFT sublingual space abscess likely related to a recent surgical procedure for mandibular tori. Approximate measurements are 10 x 35 x 20 mm.  Ill-defined edema extending across the midline in the sublingual space below the mylohyoid muscle. No oral cavity or root of tongue abscess is currently present.  Central tubular area of hypoattenuation within the  LEFT jugular vein which could represent thrombus, along with an ill-defined subcentimeter nodule, possible embolus, at the LEFT lung apex ; correlate clinically for Lemierre's syndrome ; CT chest with contrast recommended for further evaluation   Electronically Signed   By: Rolla Flatten M.D.   On: 05/02/2014 16:41    EKG: Independently reviewed. Sinus tach  Assessment/Plan Active Problems:   Abscess   Oral abscess   1. Oral abscess 1. ENT to be consulted through the ED 2. Pt has been given clindamycin in the ED 3. Will cont on Unasyn for now 4. Obtain blood cx 5. Admit to med-surg 6. Currently afebrile and no leukocytosis 7. Cont on clear liquid diet 2. HTN 1. BP stable 2. Cont home regimen 3. Tobacco abuse 1. Cessation done at bedside 2. Pt declines patch at this time 4. DVT prophylaxis 1. Heparin subQ  Code Status: Full (must indicate code status--if unknown or must be presumed, indicate  so) Family Communication: Pt and girlfriend at bedside (indicate person spoken with, if applicable, with phone number if by telephone) Disposition Plan: Pending (indicate anticipated LOS)  Time spent: 40min  [facility], [facility] Pager [phone]  If 7PM-7AM, please contact night-coverage [email or web address] Password [email or web address] 05/02/2014, 5:14 PM

## 2014-05-02 NOTE — ED Provider Notes (Signed)
CSN: [id number]     Arrival date & time 05/02/14  1157 History   First MD Initiated Contact with Patient 05/02/14 1334     Chief Complaint  Patient presents with  . Neck Pain   Patient is a 58 y.o. male presenting with neck pain.  Neck Pain Associated symptoms: fever   Associated symptoms: no headaches     [name] is a 58 year-old gentleman with a history of back pain and hypertension who presents to the emergency department for worsening mouth pain, subjective fevers, and dizziness since Sunday. Patient reports that he had oral surgery last Wednesday for "bone spurs" which was a procedure that was aimed to help his dentures in his mouth properly. He states that right after the procedure, he did have mouth pain and a sore throat. However, he felt that his symptoms worsened on Sunday. He states that he had subjective fevers and started feeling dizzy as well. He also reports having some yellow-red drainage from his mouth. Additionally, he feels that his throat has been tightening up to the point where it is hard for him to swallow foods and medications.  Patient was recently evaluated in the emergency department in July 2015 for tongue and lip swelling, secondary to angioedema caused by ACE inhibitor therapy. Patient reports that he has not taken any ACE inhibitor since that episode.  Past Medical History  Diagnosis Date  . Chronic back pain     S/P SPINAL FUSION - SOME INTERMITTEN NUMBNESS RT LEG AND CHRONIC PAIN AND STIFFNESS LUMBAR  . Hypertension    Past Surgical History  Procedure Laterality Date  . Back surgery      X 2 INCLUDING FUSION OF SPINE  . Colon surgery  2011    PARTIAL COLON RESECTION FOR CANCER  . Multiple extractions with alveoloplasty N/A 07/22/2013    Procedure: EXTRACTIONS WITH REMOVAL OF TORI AND REMOVAL OF EXOSTOSIS;  Surgeon: [name], DDS;  Location: [facility];  Service: Oral Surgery;  Laterality: N/A;  . Wisdom tooth extraction     No family history on  file. History  Substance Use Topics  . Smoking status: Current Every Day Smoker -- 0.25 packs/day for 30 years    Types: Cigarettes  . Smokeless tobacco: Never Used  . Alcohol Use: Yes     Comment: occasional    Review of Systems  Constitutional: Positive for fever and chills.  HENT: Positive for dental problem.   Gastrointestinal: Negative for nausea, vomiting, abdominal pain and diarrhea.  Genitourinary: Positive for dysuria. Negative for hematuria.  Musculoskeletal: Positive for neck pain.  Neurological: Positive for dizziness. Negative for headaches.  Psychiatric/Behavioral: Negative for confusion.   Allergies  Vicodin  Home Medications   Prior to Admission medications   Medication Sig Start Date End Date Taking? Authorizing Provider  amLODipine (NORVASC) 10 MG tablet Take 10 mg by mouth every morning.    Yes Historical Provider, MD  amLODipine (NORVASC) 5 MG tablet Take 5 mg by mouth daily.   Yes Historical Provider, MD  carvedilol (COREG) 3.125 MG tablet Take 3.125 mg by mouth 2 (two) times daily with a meal.   Yes Historical Provider, MD  Choline Fenofibrate (TRILIPIX) 135 MG capsule Take 135 mg by mouth every morning.    Yes Historical Provider, MD  oxyCODONE (ROXICODONE) 15 MG immediate release tablet Take 15 mg by mouth every 4 (four) hours as needed for pain.   Yes Historical Provider, MD   BP 135/96  Pulse 94  Temp(Src) 98.2 F (36.8 C) (Oral)  Resp 17  Wt 185 lb (83.915 kg)  SpO2 99% Physical Exam General: resting in bed HEENT: PERRL, EOMI, no scleral icterus, submandibular swelling and tenderness, mandible more tender to palpation than right  Mouth: Poor dentition throughout. Lower central gumline with redundant tissue. No discrete abscesses noted along the gums. Cardiac: RRR, no rubs, murmurs or gallops Pulm: clear to auscultation bilaterally, moving normal volumes of air Abd: soft, nontender, nondistended, BS present Ext: warm and well perfused, no pedal  edema Neuro: alert and oriented X3, cranial nerves II-XII grossly intact  ED Course  Procedures (including critical care time) Labs Review Labs Reviewed  COMPREHENSIVE METABOLIC PANEL - Abnormal; Notable for the following:    Glucose, Bld 107 (*)    Total Protein 9.5 (*)    GFR calc non Af Amer 90 (*)    Anion gap 18 (*)    All other components within normal limits  CBC WITH DIFFERENTIAL  URINALYSIS, ROUTINE W REFLEX MICROSCOPIC    Imaging Review Ct Soft Tissue Neck W Contrast  05/02/2014   CLINICAL DATA:  Neck pain.  Mouth pain after oral surgery.  EXAM: CT NECK WITH CONTRAST  TECHNIQUE: Multidetector CT imaging of the neck was performed using the standard protocol following the bolus administration of intravenous contrast.  CONTRAST:  85mL OMNIPAQUE IOHEXOL 300 MG/ML  SOLN  COMPARISON:  None.  FINDINGS: The patient appears to have had a prior surgical procedure for mandibular tori. There are no surgical reports of this however although by history from the patient this procedure was performed so that dentures would fit better.  There is a LEFT sublingual space abscess on the LEFT which is pleomorphic in extent, greater posteriorly than anteriorly, but extending toward the midline. Cross-sectional measurements of the enhancing, well-defined abscess are approximately 10 x 35 x 20 mm. There is hyperdense material closely applied to the RIGHT mandible, and medial to the abscess cavity, which could represent residual tori.  There is ill-defined edema extending across the midline below the mylohyoid muscle. Although none is present at this time, the potential exists for an oral cavity or root of tongue abscess, and surgical consultation is warranted to avert worsening which might lead to displacement of the tongue posteriorly.  There is reactive level I and II adenopathy. The LEFT submandibular gland appears mildly inflamed. There is carotid atherosclerosis. Subcentimeter sized areas of hypoattenuation  in the LEFT lobe of the thyroid likely representing incidental cysts.  There is a central area of hypoattenuation within the LEFT jugular vein which could represent thrombus (image 65 series 3). Alternatively this could be flow related artifact although the LEFT jugular vein is larger than the RIGHT. COPD is present with multiple air cysts. There is a ill-defined subcentimeter nodule at the LEFT lung apex (image 23 series 3) which in the setting of suspected jugular vein thrombosis, could represent a septic embolus; CT chest with contrast could be helpful in further assessment to look for other possible emboli.  Cervical spondylosis. Negative intracranial compartment. Negative orbits. Retention cyst formation RIGHT maxillary sinus.  IMPRESSION: LEFT sublingual space abscess likely related to a recent surgical procedure for mandibular tori. Approximate measurements are 10 x 35 x 20 mm.  Ill-defined edema extending across the midline in the sublingual space below the mylohyoid muscle. No oral cavity or root of tongue abscess is currently present.  Central tubular area of hypoattenuation within the LEFT jugular vein which could represent thrombus, along with an  ill-defined subcentimeter nodule, possible embolus, at the LEFT lung apex ; correlate clinically for Lemierre's syndrome ; CT chest with contrast recommended for further evaluation   Electronically Signed   By: Rolla Flatten M.D.   On: 05/02/2014 16:41     EKG Interpretation None      MDM   Final diagnoses:  Abscess    Patient is a 58 year old gentleman presenting status post oral surgery with mouth pain and swelling. Vital signs within normal limits. No evidence of leukocytosis or systemic infection at this point. No evidence of respiratory compromise. Increased swelling around submandibular area concerning for abscess.  - CT neck with contrast to evaluate for abscess.   - Urinalysis for dysuria.  - Morphine for palliation   - CT scan of neck  with contrast pending.    - Patient found to have a 2 cm submental abscess on CT scan. Patient to be admitted to hospitalist service, with ENT consulting. Patient started on clindamycin.  Luan Moore, MD 05/02/14 1700

## 2014-05-02 NOTE — ED Notes (Addendum)
Had surgery onleft and rt side of mouth  last week on wed  and on Sunday started to feel bad  Weak sweating dizzy and pain  Mouth has been hurting  States was told not to smoke and he did feels like his throat is sore and swelling

## 2014-05-02 NOTE — Anesthesia Procedure Notes (Signed)
Procedure Name: Intubation Date/Time: 05/02/2014 7:15 PM Performed by: Manuela Schwartz B Pre-anesthesia Checklist: Patient identified, Emergency Drugs available, Suction available, Patient being monitored and Timeout performed Patient Re-evaluated:Patient Re-evaluated prior to inductionOxygen Delivery Method: Circle system utilized Preoxygenation: Pre-oxygenation with 100% oxygen Intubation Type: IV induction and Rapid sequence Tube size: 7.0 mm Number of attempts: 1 Airway Equipment and Method: Stylet and Video-laryngoscopy (elective glidescope intubation) Placement Confirmation: ETT inserted through vocal cords under direct vision,  positive ETCO2 and breath sounds checked- equal and bilateral Secured at: 24 cm Tube secured with: Tape Dental Injury: Teeth and Oropharynx as per pre-operative assessment

## 2014-05-02 NOTE — H&P (Signed)
05/02/2014 5:32 PM  [name]  PREOPERATIVE HISTORY AND PHYSICAL/CONSULT NOTE  CHIEF COMPLAINT: left odontogenic abscess  HISTORY: This is a 58 year old who per history had some oral surgery with Dr. Hoyt Koch about 1 week ago to remove mandibular tori to allow his dentures to fit better. Presented to the ER with sore throat and mouth pain, CT scan in the ER demonstrates an ~ 2cm rim-enhancing fluid collection medial to the inner cortex of the left parasymphyseal mandible, presumably an abscess. ENT consulted for incision and drainage. He now presents for transoral incision and drainage of the abscess.  Dr. Simeon Craft, Alroy Dust has discussed the risks bleeding, infection, airway problems, swelling, numbness/lingual nerve injury, etc.) benefits, and alternatives of this procedure. The patient understands the risks and would like to proceed with the procedure. The chances of success of the procedure are >50% and the patient understands this. I personally performed an examination of the patient within 24 hours of the procedure.  PAST MEDICAL HISTORY: Past Medical History  Diagnosis Date  . Chronic back pain     S/P SPINAL FUSION - SOME INTERMITTEN NUMBNESS RT LEG AND CHRONIC PAIN AND STIFFNESS LUMBAR  . Hypertension     PAST SURGICAL HISTORY: Past Surgical History  Procedure Laterality Date  . Back surgery      X 2 INCLUDING FUSION OF SPINE  . Colon surgery  2011    PARTIAL COLON RESECTION FOR CANCER  . Multiple extractions with alveoloplasty N/A 07/22/2013    Procedure: EXTRACTIONS WITH REMOVAL OF TORI AND REMOVAL OF EXOSTOSIS;  Surgeon: [name], DDS;  Location: [facility];  Service: Oral Surgery;  Laterality: N/A;  . Wisdom tooth extraction      MEDICATIONS: No current facility-administered medications on file prior to encounter.   Current Outpatient Prescriptions on File Prior to Encounter  Medication Sig Dispense Refill  . amLODipine (NORVASC) 10 MG tablet Take 10 mg by mouth  every morning.       . Choline Fenofibrate (TRILIPIX) 135 MG capsule Take 135 mg by mouth every morning.         ALLERGIES: Allergies  Allergen Reactions  . Vicodin [Hydrocodone-Acetaminophen] Nausea Only      SOCIAL HISTORY:  History   Social History  . Marital Status: Divorced    Spouse Name: N/A    Number of Children: N/A  . Years of Education: N/A   Occupational History  . Not on file.   Social History Main Topics  . Smoking status: Current Every Day Smoker -- 0.25 packs/day for 30 years    Types: Cigarettes  . Smokeless tobacco: Never Used  . Alcohol Use: Yes     Comment: occasional  . Drug Use: No  . Sexual Activity: Yes    Birth Control/ Protection: Condom   Other Topics Concern  . Not on file   Social History Narrative  . No narrative on file    FAMILY HISTORY: No family history on file.  REVIEW OF SYSTEMS:  HEENT: sore throat, mouth pain, otherwise negative x 12 systems except per HPI   PHYSICAL EXAM:  GENERAL: NAD, no hot potato voice or stridor   VITAL SIGNS:   Filed Vitals:   05/02/14 1715  BP: 137/101  Pulse: 96  Temp:   Resp:    SKIN:  Warm, dry HEENT:  Mallampati class I, normal tongue mobility, some mild edema but no purulence or drainage of the left mandibular gingiva and minimal to no sublingual edema. NECK:  Supple, trachea  midline   LUNGS:  Grossly clear CARDIOVASCULAR: RRR   ABDOMEN:  soft MUSCULOSKELETAL: normal strength PSYCH:  Normal affect NEUROLOGIC:  CN 2-12 intact and symmetric  DIAGNOSTIC STUDIES: CT shows 2 x 3 cm left perimandibular fluid collection, radiology favors abscess, question left jugular vein thrombus vs. Artefact.  ASSESSMENT AND PLAN: Plan to proceed with transoral incision and drainage of left odontogenic abscess. Patient understands the risks, benefits, and alternatives. Informed written consent signed witnessed and on chart. 05/02/2014  5:32 PM [name]

## 2014-05-02 NOTE — Progress Notes (Signed)
ANTIBIOTIC CONSULT NOTE - INITIAL  Pharmacy Consult for Unasyn Indication: oral abscess  Allergies  Allergen Reactions  . Vicodin [Hydrocodone-Acetaminophen] Nausea Only    Patient Measurements: Height: 6' (182.9 cm) Weight: 185 lb (83.915 kg) IBW/kg (Calculated) : 77.6  Vital Signs: Temp: 98 F (36.7 C) (08/25 1747) Temp src: Oral (08/25 1747) BP: 141/102 mmHg (08/25 1747) Pulse Rate: 81 (08/25 1747) Intake/Output from previous day:   Intake/Output from this shift:    Labs:  Recent Labs  05/02/14 1238  WBC 6.8  HGB 16.8  PLT 321  CREATININE 0.97   Estimated Creatinine Clearance: 92.2 ml/min (by C-G formula based on Cr of 0.97). No results found for this basename: VANCOTROUGH, VANCOPEAK, VANCORANDOM, GENTTROUGH, GENTPEAK, GENTRANDOM, TOBRATROUGH, TOBRAPEAK, TOBRARND, AMIKACINPEAK, AMIKACINTROU, AMIKACIN,  in the last 72 hours   Microbiology: No results found for this or any previous visit (from the past 720 hour(s)).  Medical History: Past Medical History  Diagnosis Date  . Chronic back pain     S/P SPINAL FUSION - SOME INTERMITTEN NUMBNESS RT LEG AND CHRONIC PAIN AND STIFFNESS LUMBAR  . Hypertension     Assessment: 12 YOM s/p oral surgery ~1week ago now presenting with enhancing fluid collection which is to be I&D'd by ENT. To start Unasyn for coverage.  WBC 6.8, currently afebrile. SCr 0.97 with est CrCl ~72mL/min  Goal of Therapy:  Eradication of infection  Plan:  1. unasyn 1.5g IV q6h 2. Follow clinical progression, c/s, LOT, renal function, transition to PO abx  Yogesh Cominsky D. Edwinna Rochette, PharmD, BCPS Clinical Pharmacist Pager: [phone] 05/02/2014 6:12 PM

## 2014-05-02 NOTE — Progress Notes (Signed)
Tried to call report. Pt in the OR.

## 2014-05-02 NOTE — ED Notes (Signed)
Pt unable to void at this time urinal at bedside 

## 2014-05-02 NOTE — ED Notes (Signed)
Pt has a palpable mass to the left lower jaw that is tender to the touch.  Pt has mild swelling to the right lower jaw with mild tenderness.  Pt is unable to move his head to the left.  Pt has pain that is running from the jaw down to the neck.  Pt has been progressively getting worse causing pt to come to the ED.

## 2014-05-02 NOTE — Progress Notes (Signed)
Subjective: POD#0 from incision and drainage of small left perimandibular hematoma. Awake, alert, conversant  Objective: Vital signs in last 24 hours: Temp:  [97.9 F (36.6 C)-98.6 F (37 C)] 97.9 F (36.6 C) (08/25 1951) Pulse Rate:  [81-124] 104 (08/25 1838) Resp:  [17-20] 20 (08/25 1838) BP: (115-146)/(72-113) 146/113 mmHg (08/25 1951) SpO2:  [95 %-100 %] 99 % (08/25 1838) Weight:  [83.915 kg (185 lb)] 83.915 kg (185 lb) (08/25 1238)  Normal tongue mobility, left lower gingival incision patent with minimal/mild sanguinous drainage, no purulence. Poor dentition. Minimal to no floor of mouth edema  @LABLAST2 (wbc:2,hgb:2,hct:2,plt:2)  Recent Labs  05/02/14 1238 05/02/14 1825  NA 137  --   K 4.4  --   CL 97  --   CO2 22  --   GLUCOSE 107*  --   BUN 15  --   CREATININE 0.97 0.77  CALCIUM 10.5  --     Medications:  No current facility-administered medications on file prior to encounter.   Current Outpatient Prescriptions on File Prior to Encounter  Medication Sig Dispense Refill  . amLODipine (NORVASC) 10 MG tablet Take 10 mg by mouth every morning.       . Choline Fenofibrate (TRILIPIX) 135 MG capsule Take 135 mg by mouth every morning.         Assessment/Plan: S/p incision and drainage of a small post-op left gingival/perimandibular hematoma with no abscess or purulence seen. ENT will sign off. The patient can be monitored by hospitalist service and discharged when he is feeling better and taking adequate PO. Can follow up with his oral surgeon Dr. Hoyt Koch in the next week. Can go home on PRN pain meds if needed, an antibiotic to cover oral flora, and chlorhexidine mouthwash.   LOS: 0 days   [name] 05/02/2014, 8:14 PM

## 2014-05-02 NOTE — Anesthesia Postprocedure Evaluation (Signed)
Anesthesia Post Note  Patient: [name]  Procedure(s) Performed: Procedure(s) (LRB): Incision and and drainage of left oral cavity abscess (Left)  Anesthesia type: general  Patient location: PACU  Post pain: Pain level controlled  Post assessment: Patient's Cardiovascular Status Stable  Last Vitals:  Filed Vitals:   05/02/14 2051  BP: 128/90  Pulse: 104  Temp: 36.7 C  Resp: 16    Post vital signs: Reviewed and stable  Level of consciousness: sedated  Complications: No apparent anesthesia complications

## 2014-05-02 NOTE — Anesthesia Preprocedure Evaluation (Signed)
Anesthesia Evaluation  Patient identified by MRN, date of birth, ID band Patient awake    Reviewed: Allergy & Precautions, H&P , NPO status , Patient's Chart, lab work & pertinent test results  Airway     Mouth opening: Limited Mouth Opening  Dental  (+) Poor Dentition, Loose   Pulmonary Current Smoker,          Cardiovascular hypertension, Pt. on medications Rhythm:Regular Rate:Tachycardia     Neuro/Psych    GI/Hepatic   Endo/Other    Renal/GU      Musculoskeletal   Abdominal   Peds  Hematology   Anesthesia Other Findings   Reproductive/Obstetrics                           Anesthesia Physical Anesthesia Plan  ASA: II  Anesthesia Plan: General   Post-op Pain Management:    Induction: Intravenous  Airway Management Planned: Oral ETT  Additional Equipment:   Intra-op Plan:   Post-operative Plan: Extubation in OR  Informed Consent: I have reviewed the patients History and Physical, chart, labs and discussed the procedure including the risks, benefits and alternatives for the proposed anesthesia with the patient or authorized representative who has indicated his/her understanding and acceptance.   Dental advisory given  Plan Discussed with: Anesthesiologist and Surgeon  Anesthesia Plan Comments:         Anesthesia Quick Evaluation

## 2014-05-02 NOTE — Op Note (Signed)
DATE OF OPERATION: 05/02/2014 Surgeon: [name] Procedure Performed: 10060-simple incision and drainage of left mandibular hematoma PREOPERATIVE DIAGNOSIS: s/p recent oral surgery with possible left mandibular abscess POSTOPERATIVE DIAGNOSIS: left sublingual/perimandibular hematoma SURGEON: [name] ANESTHESIA: General endotracheal.  ESTIMATED BLOOD LOSS: less than 18mL DRAINS: none SPECIMENS: left mandibular culture INDICATIONS: The patient is a 58yo with a history of recent oral surgery with possible left mandibular abscess, this was a small hematoma on operative exploration DESCRIPTION OF OPERATION: The patient was brought to the operating room and was placed in the supine position and was placed under general endotracheal anesthesia by anesthesiology. The bite block was placed in the right dentition to open the mouth. I retracted the tongue to the right and examined the left floor of mouth/medial perimandibular area. There was what appeared to be a healing left gingival incision but no purulence. I performed a transoral and transcervical FNA with an 18 gauge needle and 49mL syringe and drained only a small amount of sanguinous fluid, no pus. I sent the FNA fluid for culture. I reopened the previous left floor of mouth/perimandibular gingival incision widely with a large hemostat and drained 2 to 5mL of blood clot/sanguinous fluid. No purulence or evidence of abscess was seen. Once I had opened the incision/hematoma cavity widely I irrigated out the incision with 251mL of sterile saline. I left the incision open to heal secondarily. The oral cavity and oropharynx were irrigated out and then the the nose, oral cavity,  and stomach were suctioned out with a flexible suction catheter. The patient was turned back to anesthesia and awakened from anesthesia and extubated without difficulty. The patient tolerated the procedure well with no immediate complications and was taken to the postoperative  recovery area in good condition.   Dr. [name] was present and performed the entire procedure. 05/02/2014  7:51 PM [name]

## 2014-05-02 NOTE — Transfer of Care (Signed)
Immediate Anesthesia Transfer of Care Note  Patient: [name]  Procedure(s) Performed: Procedure(s): Incision and and drainage of left oral cavity abscess (Left)  Patient Location: PACU  Anesthesia Type:General  Level of Consciousness: awake, alert  and oriented  Airway & Oxygen Therapy: Patient Spontanous Breathing  Post-op Assessment: Report given to PACU RN and Post -op Vital signs reviewed and stable  Post vital signs: Reviewed and stable  Complications: No apparent anesthesia complications

## 2014-05-03 ENCOUNTER — Encounter (HOSPITAL_COMMUNITY): Payer: Self-pay | Admitting: Otolaryngology

## 2014-05-03 DIAGNOSIS — E785 Hyperlipidemia, unspecified: Secondary | ICD-10-CM

## 2014-05-03 LAB — URINALYSIS, ROUTINE W REFLEX MICROSCOPIC
BILIRUBIN URINE: NEGATIVE
GLUCOSE, UA: NEGATIVE mg/dL
HGB URINE DIPSTICK: NEGATIVE
Ketones, ur: 15 mg/dL — AB
Leukocytes, UA: NEGATIVE
Nitrite: NEGATIVE
Protein, ur: NEGATIVE mg/dL
SPECIFIC GRAVITY, URINE: 1.01 (ref 1.005–1.030)
UROBILINOGEN UA: 1 mg/dL (ref 0.0–1.0)
pH: 6 (ref 5.0–8.0)

## 2014-05-03 LAB — COMPREHENSIVE METABOLIC PANEL
ALK PHOS: 59 U/L (ref 39–117)
ALT: 17 U/L (ref 0–53)
ANION GAP: 16 — AB (ref 5–15)
AST: 24 U/L (ref 0–37)
Albumin: 3.4 g/dL — ABNORMAL LOW (ref 3.5–5.2)
BILIRUBIN TOTAL: 0.3 mg/dL (ref 0.3–1.2)
BUN: 13 mg/dL (ref 6–23)
CHLORIDE: 95 meq/L — AB (ref 96–112)
CO2: 20 meq/L (ref 19–32)
Calcium: 10.3 mg/dL (ref 8.4–10.5)
Creatinine, Ser: 0.71 mg/dL (ref 0.50–1.35)
GFR calc Af Amer: >90 mL/min (ref >90)
GFR calc non Af Amer: >90 mL/min (ref >90)
GLUCOSE: 151 mg/dL — AB (ref 70–99)
POTASSIUM: 4.8 meq/L (ref 3.7–5.3)
SODIUM: 131 meq/L — AB (ref 137–147)
Total Protein: 8.7 g/dL — ABNORMAL HIGH (ref 6.0–8.3)

## 2014-05-03 LAB — CBC
HCT: 42 % (ref 39.0–52.0)
HEMOGLOBIN: 15.1 g/dL (ref 13.0–17.0)
MCH: 32.6 pg (ref 26.0–34.0)
MCHC: 36 g/dL (ref 30.0–36.0)
MCV: 90.7 fL (ref 78.0–100.0)
Platelets: 327 10*3/uL (ref 150–400)
RBC: 4.63 MIL/uL (ref 4.22–5.81)
RDW: 12.2 % (ref 11.5–15.5)
WBC: 7.2 10*3/uL (ref 4.0–10.5)

## 2014-05-03 MED ORDER — OXYCODONE HCL 5 MG PO TABS
5.0000 mg | ORAL_TABLET | ORAL | Status: DC | PRN
  Administered 2014-05-03 – 2014-05-04 (×5): 10 mg via ORAL
  Filled 2014-05-03 (×5): qty 2

## 2014-05-03 MED ORDER — RISAQUAD PO CAPS
2.0000 | ORAL_CAPSULE | Freq: Three times a day (TID) | ORAL | Status: DC
  Administered 2014-05-03 – 2014-05-04 (×5): 2 via ORAL
  Filled 2014-05-03 (×7): qty 2

## 2014-05-03 MED ORDER — OXYCODONE HCL 5 MG PO TABS
5.0000 mg | ORAL_TABLET | ORAL | Status: DC | PRN
  Administered 2014-05-03: 10 mg via ORAL
  Filled 2014-05-03 (×2): qty 1

## 2014-05-03 MED ORDER — CLINDAMYCIN PHOSPHATE 600 MG/50ML IV SOLN
600.0000 mg | Freq: Four times a day (QID) | INTRAVENOUS | Status: DC
  Administered 2014-05-03 – 2014-05-04 (×5): 600 mg via INTRAVENOUS
  Filled 2014-05-03 (×7): qty 50

## 2014-05-03 MED ORDER — HYDROMORPHONE HCL PF 1 MG/ML IJ SOLN
1.0000 mg | INTRAMUSCULAR | Status: DC | PRN
  Administered 2014-05-03 (×2): 1 mg via INTRAVENOUS
  Filled 2014-05-03 (×2): qty 1

## 2014-05-03 MED ORDER — BACID PO TABS
2.0000 | ORAL_TABLET | Freq: Three times a day (TID) | ORAL | Status: DC
  Filled 2014-05-03 (×3): qty 2

## 2014-05-03 NOTE — Progress Notes (Signed)
[facility] PROGRESS NOTE  [name] DZH:[id number] DOB: 1956-01-03 DOA: 05/02/2014 PCP: Ricke Hey, MD  Assessment/Plan:  Left submandibular abscess  -admitted and started on Clinda/Unasyn-day 2. ENT consulted, underwent I&D on 8/25, intra-operative cultures pending. Currently afebrile and no leukocytosis. Will stop Unasyn, and just continue with Clindamycin.  -blood negative up to date-continue to follow  HTN  BP stable Cont Amlodipine and coreg  Hyponatremia -Possible secondary to hypovolemia - minimal -continue to follow  Hyperlipidemia Continue home med-Fenofibrate  Hyperglycemia Possible secondary to steroid injection post I&D Recommend outpatient follow up  Tobacco abuse  Cessation done at bedside  Code Status: full Family Communication: No family at bedside Disposition Plan: Home when medically stable   Consultants:  None  Procedures:  None  Antibiotics:  Unasyn (8/25)>>>  Clindamycin (8/25) >>>  HPI: [name] is a 58 yo with PMH of recent jaw surgery (8/19) for removal of mandibular forti to enhance the fit of his dentures, hypertension and back pain that presented to the ED for worsening mouth pain, subjective fevers, and dizziness since Sunday. He states that symptoms started after surgery but have progressively gotten worse.  He has associated sore throat, tongue swelling, and  yellow-red drainage from his mouth. In the ED CT demonstrated 2 cm submental abscess.  I & D was performed and he is started on clindamycin.    Subjective: Patient complains of jaw pain and sore throat.  He denies any trouble swallowing.  Tolerated well his soft food diet.     Objective: Filed Vitals:   05/03/14 1025  BP: 121/84  Pulse:   Temp:   Resp:     Intake/Output Summary (Last 24 hours) at 05/03/14 1250 Last data filed at 05/03/14 1057  Gross per 24 hour  Intake    500 ml  Output    200 ml  Net    300 ml   Filed Weights   05/02/14  1238 05/02/14 2051  Weight: 83.915 kg (185 lb) 76.7 kg (169 lb 1.5 oz)    Exam:  Gen: Alert and oriented, in no acute distress HEENT:  PERRL, EOMI, no sclera icterus.  Neck is supple, with midline trachea.  diffuse submandibular tenderness with left mandibular swelling.  Mouth with poor dentition, missing bottom front incisors, tongue mobile without swelling or erythema.   Chest: clear to auscultate bilaterally, no ronchi or rales  Cardiac: Regular rate and rhythm, S1-S2, no rubs murmurs or gallops  Abdomen: soft, non tender, non distended, +bowel sounds. No guarding or rigidity  Extremities: Symmetrical in appearance without cyanosis or edema  Neurological: Alert awake oriented to time place and person.  Psychiatric: Appears normal.   Data Reviewed: Basic Metabolic Panel:  Recent Labs Lab 05/02/14 1238 05/02/14 1825 05/03/14 0647  NA 137  --  131*  K 4.4  --  4.8  CL 97  --  95*  CO2 22  --  20  GLUCOSE 107*  --  151*  BUN 15  --  13  CREATININE 0.97 0.77 0.71  CALCIUM 10.5  --  10.3   Liver Function Tests:  Recent Labs Lab 05/02/14 1238 05/03/14 0647  AST 22 24  ALT 15 17  ALKPHOS 68 59  BILITOT 0.4 0.3  PROT 9.5* 8.7*  ALBUMIN 4.0 3.4*   No results found for this basename: LIPASE, AMYLASE,  in the last 168 hours No results found for this basename: AMMONIA,  in the last 168 hours CBC:  Recent Labs Lab 05/02/14  1238 05/02/14 1825 05/03/14 0647  WBC 6.8 6.7 7.2  NEUTROABS 3.6  --   --   HGB 16.8 15.0 15.1  HCT 48.6 42.6 42.0  MCV 93.3 91.0 90.7  PLT 321 290 327   Cardiac Enzymes: No results found for this basename: CKTOTAL, CKMB, CKMBINDEX, TROPONINI,  in the last 168 hours BNP (last 3 results) No results found for this basename: PROBNP,  in the last 8760 hours CBG: No results found for this basename: GLUCAP,  in the last 168 hours  Recent Results (from the past 240 hour(s))  CULTURE, BLOOD (ROUTINE X 2)     Status: None   Collection Time     05/02/14  6:25 PM      Result Value Ref Range Status   Specimen Description BLOOD ARM RIGHT   Final   Special Requests BOTTLES DRAWN AEROBIC AND ANAEROBIC 10CC   Final   Culture  Setup Time     Final   Value: 05/02/2014 22:27     Performed at [facility]   Culture     Final   Value:        BLOOD CULTURE RECEIVED NO GROWTH TO DATE CULTURE WILL BE HELD FOR 5 DAYS BEFORE ISSUING A FINAL NEGATIVE REPORT     Performed at [facility]   Report Status PENDING   Incomplete  CULTURE, BLOOD (ROUTINE X 2)     Status: None   Collection Time    05/02/14  6:40 PM      Result Value Ref Range Status   Specimen Description BLOOD HAND RIGHT   Final   Special Requests BOTTLES DRAWN AEROBIC AND ANAEROBIC 10CC   Final   Culture  Setup Time     Final   Value: 05/02/2014 22:25     Performed at [facility]   Culture     Final   Value:        BLOOD CULTURE RECEIVED NO GROWTH TO DATE CULTURE WILL BE HELD FOR 5 DAYS BEFORE ISSUING A FINAL NEGATIVE REPORT     Performed at [facility]   Report Status PENDING   Incomplete  CULTURE, ROUTINE-ABSCESS     Status: None   Collection Time    05/02/14  7:25 PM      Result Value Ref Range Status   Specimen Description ABSCESS MOUTH   Final   Special Requests     Final   Value: PATIENT ON FOLLOWING UNASYN, CLEOCIN LEFT ORAL CAVITY   Gram Stain     Final   Value: MODERATE WBC PRESENT,BOTH PMN AND MONONUCLEAR     NO SQUAMOUS EPITHELIAL CELLS SEEN     FEW GRAM POSITIVE COCCI     IN PAIRS FEW GRAM VARIABLE ROD     Performed at [facility]     Final   Value: NO GROWTH     Performed at [facility]   Report Status PENDING   Incomplete  ANAEROBIC CULTURE     Status: None   Collection Time    05/02/14  7:25 PM      Result Value Ref Range Status   Specimen Description ABSCESS MOUTH   Final   Special Requests     Final   Value: PATIENT ON FOLLOWING UNASYN, CLEOCIN LEFT ORAL CAVITY   Gram Stain     Final    Value: MODERATE WBC PRESENT,BOTH PMN AND MONONUCLEAR     NO SQUAMOUS EPITHELIAL CELLS  SEEN     FEW GRAM POSITIVE COCCI     IN PAIRS FEW GRAM VARIABLE ROD     Performed at [facility]   Culture     Final   Value: NO ANAEROBES ISOLATED; CULTURE IN PROGRESS FOR 5 DAYS     Performed at [facility]   Report Status PENDING   Incomplete     Studies: Ct Soft Tissue Neck W Contrast  05/02/2014   CLINICAL DATA:  Neck pain.  Mouth pain after oral surgery.  EXAM: CT NECK WITH CONTRAST  TECHNIQUE: Multidetector CT imaging of the neck was performed using the standard protocol following the bolus administration of intravenous contrast.  CONTRAST:  52mL OMNIPAQUE IOHEXOL 300 MG/ML  SOLN  COMPARISON:  None.  FINDINGS: The patient appears to have had a prior surgical procedure for mandibular tori. There are no surgical reports of this however although by history from the patient this procedure was performed so that dentures would fit better.  There is a LEFT sublingual space abscess on the LEFT which is pleomorphic in extent, greater posteriorly than anteriorly, but extending toward the midline. Cross-sectional measurements of the enhancing, well-defined abscess are approximately 10 x 35 x 20 mm. There is hyperdense material closely applied to the RIGHT mandible, and medial to the abscess cavity, which could represent residual tori.  There is ill-defined edema extending across the midline below the mylohyoid muscle. Although none is present at this time, the potential exists for an oral cavity or root of tongue abscess, and surgical consultation is warranted to avert worsening which might lead to displacement of the tongue posteriorly.  There is reactive level I and II adenopathy. The LEFT submandibular gland appears mildly inflamed. There is carotid atherosclerosis. Subcentimeter sized areas of hypoattenuation in the LEFT lobe of the thyroid likely representing incidental cysts.  There is a central  area of hypoattenuation within the LEFT jugular vein which could represent thrombus (image 65 series 3). Alternatively this could be flow related artifact although the LEFT jugular vein is larger than the RIGHT. COPD is present with multiple air cysts. There is a ill-defined subcentimeter nodule at the LEFT lung apex (image 23 series 3) which in the setting of suspected jugular vein thrombosis, could represent a septic embolus; CT chest with contrast could be helpful in further assessment to look for other possible emboli.  Cervical spondylosis. Negative intracranial compartment. Negative orbits. Retention cyst formation RIGHT maxillary sinus.  IMPRESSION: LEFT sublingual space abscess likely related to a recent surgical procedure for mandibular tori. Approximate measurements are 10 x 35 x 20 mm.  Ill-defined edema extending across the midline in the sublingual space below the mylohyoid muscle. No oral cavity or root of tongue abscess is currently present.  Central tubular area of hypoattenuation within the LEFT jugular vein which could represent thrombus, along with an ill-defined subcentimeter nodule, possible embolus, at the LEFT lung apex ; correlate clinically for Lemierre's syndrome ; CT chest with contrast recommended for further evaluation   Electronically Signed   By: Rolla Flatten M.D.   On: 05/02/2014 16:41    Scheduled Meds: . acidophilus  2 capsule Oral TID AC  . amLODipine  10 mg Oral q morning - 10a  . ampicillin-sulbactam (UNASYN) IV  1.5 g Intravenous Q6H  . carvedilol  3.125 mg Oral BID WC  . chlorhexidine  15 mL Mouth/Throat QID  . clindamycin (CLEOCIN) IV  600 mg Intravenous 4 times per day  . dexamethasone  10 mg Intravenous 3 times per day  . fenofibrate  160 mg Oral Daily  . heparin  5,000 Units Subcutaneous 3 times per day   Continuous Infusions:   Active Problems:   Abscess   Oral abscess    Time spent: 30  Tana Felts  [facility] Pager [phone]. If  7PM-7AM, please contact night-coverage at [email or web address], password [facility] 05/03/2014, 12:50 PM  LOS: 1 day    Attending Patient was seen, examined,treatment plan was discussed with the Physician extender. I have directly reviewed the clinical findings, lab, imaging studies and management of this patient in detail. I have made the necessary changes to the above noted documentation, and agree with the documentation, as recorded by the Physician extender.  Nena Alexander MD [facility].

## 2014-05-03 NOTE — ED Provider Notes (Signed)
I saw and evaluated the patient, reviewed the resident's note and I agree with the findings and plan.  The patient presented to the emergency complaints of gradual onset of jaw and neck swelling following recent oral surgery. Patient states he had a procedure to remove some spurs in order to allow his dentures to fit better. He tried to get in touch with his oral surgeon but was not able to get an appointment. He denies any fevers but has had increasing pain.  On exam the patient's airway was patent. He was able to speak in full sentences without difficulty. There was no drooling or stridor. He did have tenderness to palpation in the submandibular region with some lymphadenopathy and swelling of the left side.  CT scan revealed a sub- lingual abscess.    IV antibiotics were initiated. The patient admitted to the hospital for further treatment. A consult to ENT was placed. There is no oral surgery on-call.  Dorie Rank, MD 05/03/14 [phone]

## 2014-05-03 NOTE — Plan of Care (Signed)
Problem: Phase I Progression Outcomes Goal: Initial discharge plan identified Outcome: Completed/Met Date Met:  05/03/14 To return home

## 2014-05-03 NOTE — Progress Notes (Signed)
Utilization review completed.  

## 2014-05-04 MED ORDER — CHLORHEXIDINE GLUCONATE 0.12 % MT SOLN: 10.0000 mL | Freq: Four times a day (QID) | OROMUCOSAL | Status: DC

## 2014-05-04 MED ORDER — OXYCODONE HCL 5 MG PO TABS: 5.0000 mg | ORAL_TABLET | Freq: Four times a day (QID) | ORAL | Status: DC | PRN

## 2014-05-04 MED ORDER — CLINDAMYCIN HCL 300 MG PO CAPS
300.0000 mg | ORAL_CAPSULE | Freq: Four times a day (QID) | ORAL | Status: DC
  Filled 2014-05-04 (×3): qty 1

## 2014-05-04 MED ORDER — CLINDAMYCIN HCL 300 MG PO CAPS: 300.0000 mg | ORAL_CAPSULE | Freq: Three times a day (TID) | ORAL | Status: DC

## 2014-05-04 NOTE — Progress Notes (Signed)
[name] discharged Home per MD order.  Discharge instructions reviewed and discussed with the patient, all questions and concerns answered. Copy of instructions, care notes for new diagnosis & medications and scripts given to patient.    Medication List         amLODipine 10 MG tablet  Commonly known as:  NORVASC  Take 10 mg by mouth every morning.     carvedilol 3.125 MG tablet  Commonly known as:  COREG  Take 3.125 mg by mouth 2 (two) times daily with a meal.     chlorhexidine 0.12 % solution  Commonly known as:  PERIDEX  Use as directed 10 mLs in the mouth or throat 4 (four) times daily.     clindamycin 300 MG capsule  Commonly known as:  CLEOCIN  Take 1 capsule (300 mg total) by mouth 3 (three) times daily.     oxyCODONE 5 MG immediate release tablet  Commonly known as:  Oxy IR/ROXICODONE  Take 1 tablet (5 mg total) by mouth every 6 (six) hours as needed for moderate pain.     TRILIPIX 135 MG capsule  Generic drug:  Choline Fenofibrate  Take 135 mg by mouth every morning.        Patients skin is clean, dry and intact, no evidence of skin break down. IV site discontinued and catheter remains intact. Site without signs and symptoms of complications. Dressing and pressure applied.  Patient escorted to car by NT ambulating,  no distress noted upon discharge.  Wynetta Emery, Bonny Egger C 05/04/2014 12:19 PM

## 2014-05-04 NOTE — Discharge Summary (Addendum)
Physician Discharge Summary  CHRIST FULLENWIDER OZD:[id number] DOB: 07-Oct-1955 DOA: 05/02/2014  PCP: Ricke Hey, MD  Admit date: 05/02/2014 Discharge date: 05/04/2014  Time spent:  minutes  Recommendations for Outpatient Follow-up:  1. Follow up with Dr.  Hoyt Koch in one week to reassess your oral surgery. 2. PCP- Please rechek BMET and Hgb A1C as patient had mild hyponatremia and was hyperglycemic. 3. Take your prescribed antibiotics, Clindamycin, three times daily for 7 days.   4. Use prescribed antibacterial mouthwash four times daily, gargle and spit out, do not swallow. 5. You make take OTC tylenol for pain, if severe pain, take prescribed oxycodone.   6. Return to the ED if you develop fever, severe pain, swelling, redness, discharge or bleeding from your mouth 7. Homehealth   Discharge Diagnoses:  Active Problems:   Abscess   Oral abscess   Discharge Condition: Stable  Diet recommendation: Heart healthy diet  Filed Weights   05/02/14 1238 05/02/14 2051  Weight: 83.915 kg (185 lb) 76.7 kg (169 lb 1.5 oz)    History of present illness:  Mr. Gallardo is a 58 yo with PMH of recent jaw surgery (8/19) for removal of mandibular forti to enhance the fit of his dentures, hypertension and back pain that presented to the ED for worsening mouth pain, subjective fevers, and dizziness since Sunday. He states that symptoms started after surgery but have progressively gotten worse. He has associated sore throat, tongue swelling, and yellow-red drainage from his mouth. In the ED CT demonstrated 2 cm submental abscess. I & D was performed and he is started on clindamycin and unasyn.  He is then discharged on clindamycin, three times daily, for 7 days.    Hospital Course:   Left submandibular abscess  -Admitted placed on Clinda/Unasyn for three days. Unasyn subsequently discontinued.-ENT consulted, underwent I&D on 8/25, intra-operative cultures negative so far. Afebrile, no leukocytosis-  pain much improved, minimal swelling and is able to tolerate a regular diet. -On discharge, placed on clindamycin 300 mg TID for 7 days.Patient instructed to follow up with Oral Surgeon-Dr Hoyt Koch -Blood negative up to date  HTN  BP stable  Cont Amlodipine and coreg on discharge  Hyponatremia  -minimal- appea likely due to hypovolemia  -follow BMET as outpatient  Hyperlipidemia  Continue home regimen of fenofibrate   Hyperglycemia  Possible secondary to steroid injection post I&D  follow up with PCP to assess A1c and check if you have diabetes  Tobacco abuse  Cessation done at bedside  Procedures:  8/25- Incision and Drainage of submandibular abscess.    Consultations:  None  Discharge Exam: Filed Vitals:   05/04/14 1016  BP: 113/81  Pulse:   Temp:   Resp:      Exam Gen: Alert and oriented, in no acute distress  HEENT: PERRL, EOMI, no sclera icterus. Neck is supple, with midline trachea. Minimal  left mandibular swelling and tenderness.   Mouth with poor dentition, missing bottom front incisors, tongue mobile without swelling or erythema.  Chest: clear to auscultate bilaterally, no ronchi or rales  Cardiac: Regular rate and rhythm, S1-S2, no rubs murmurs or gallops  Abdomen: soft, non tender, non distended, +bowel sounds. No guarding or rigidity  Extremities: Symmetrical in appearance without cyanosis or edema  Neurological: Alert awake oriented to time place and person.  Psychiatric: Appears normal.   Discharge Instructions      Discharge Instructions   Call MD for:  persistant nausea and vomiting    Complete by:  As directed      Call MD for:  redness, tenderness, or signs of infection (pain, swelling, redness, odor or green/yellow discharge around incision site)    Complete by:  As directed      Call MD for:  severe uncontrolled pain    Complete by:  As directed      Call MD for:  temperature >100.4    Complete by:  As directed      Diet - low sodium  heart healthy    Complete by:  As directed      Increase activity slowly    Complete by:  As directed             Medication List         amLODipine 10 MG tablet  Commonly known as:  NORVASC  Take 10 mg by mouth every morning.     carvedilol 3.125 MG tablet  Commonly known as:  COREG  Take 3.125 mg by mouth 2 (two) times daily with a meal.     chlorhexidine 0.12 % solution  Commonly known as:  PERIDEX  Use as directed 10 mLs in the mouth or throat 4 (four) times daily.     clindamycin 300 MG capsule  Commonly known as:  CLEOCIN  Take 1 capsule (300 mg total) by mouth 3 (three) times daily.     oxyCODONE 5 MG immediate release tablet  Commonly known as:  Oxy IR/ROXICODONE  Take 1 tablet (5 mg total) by mouth every 6 (six) hours as needed for moderate pain.     TRILIPIX 135 MG capsule  Generic drug:  Choline Fenofibrate  Take 135 mg by mouth every morning.       Allergies  Allergen Reactions  . Vicodin [Hydrocodone-Acetaminophen] Nausea Only   Follow-up Information   Follow up with Ricke Hey, MD. Schedule an appointment as soon as possible for a visit in 1 week.   Specialty:  Family Medicine   Contact information:   [place] [address] [phone]       Follow up with [name], DDS. Schedule an appointment as soon as possible for a visit in 5 days.   Specialty:  Oral Surgery   Contact information:   [place] [address] [phone]        The results of significant diagnostics from this hospitalization (including imaging, microbiology, ancillary and laboratory) are listed below for reference.    Significant Diagnostic Studies: Ct Soft Tissue Neck W Contrast  05/02/2014   CLINICAL DATA:  Neck pain.  Mouth pain after oral surgery.  EXAM: CT NECK WITH CONTRAST  TECHNIQUE: Multidetector CT imaging of the neck was performed using the standard protocol following the bolus administration of intravenous  contrast.  CONTRAST:  55mL OMNIPAQUE IOHEXOL 300 MG/ML  SOLN  COMPARISON:  None.  FINDINGS: The patient appears to have had a prior surgical procedure for mandibular tori. There are no surgical reports of this however although by history from the patient this procedure was performed so that dentures would fit better.  There is a LEFT sublingual space abscess on the LEFT which is pleomorphic in extent, greater posteriorly than anteriorly, but extending toward the midline. Cross-sectional measurements of the enhancing, well-defined abscess are approximately 10 x 35 x 20 mm. There is hyperdense material closely applied to the RIGHT mandible, and medial to the abscess cavity, which could represent residual tori.  There is ill-defined edema extending across the midline  below the mylohyoid muscle. Although none is present at this time, the potential exists for an oral cavity or root of tongue abscess, and surgical consultation is warranted to avert worsening which might lead to displacement of the tongue posteriorly.  There is reactive level I and II adenopathy. The LEFT submandibular gland appears mildly inflamed. There is carotid atherosclerosis. Subcentimeter sized areas of hypoattenuation in the LEFT lobe of the thyroid likely representing incidental cysts.  There is a central area of hypoattenuation within the LEFT jugular vein which could represent thrombus (image 65 series 3). Alternatively this could be flow related artifact although the LEFT jugular vein is larger than the RIGHT. COPD is present with multiple air cysts. There is a ill-defined subcentimeter nodule at the LEFT lung apex (image 23 series 3) which in the setting of suspected jugular vein thrombosis, could represent a septic embolus; CT chest with contrast could be helpful in further assessment to look for other possible emboli.  Cervical spondylosis. Negative intracranial compartment. Negative orbits. Retention cyst formation RIGHT maxillary sinus.   IMPRESSION: LEFT sublingual space abscess likely related to a recent surgical procedure for mandibular tori. Approximate measurements are 10 x 35 x 20 mm.  Ill-defined edema extending across the midline in the sublingual space below the mylohyoid muscle. No oral cavity or root of tongue abscess is currently present.  Central tubular area of hypoattenuation within the LEFT jugular vein which could represent thrombus, along with an ill-defined subcentimeter nodule, possible embolus, at the LEFT lung apex ; correlate clinically for Lemierre's syndrome ; CT chest with contrast recommended for further evaluation   Electronically Signed   By: Rolla Flatten M.D.   On: 05/02/2014 16:41    Microbiology: Recent Results (from the past 240 hour(s))  CULTURE, BLOOD (ROUTINE X 2)     Status: None   Collection Time    05/02/14  6:25 PM      Result Value Ref Range Status   Specimen Description BLOOD ARM RIGHT   Final   Special Requests BOTTLES DRAWN AEROBIC AND ANAEROBIC 10CC   Final   Culture  Setup Time     Final   Value: 05/02/2014 22:27     Performed at [facility]   Culture     Final   Value:        BLOOD CULTURE RECEIVED NO GROWTH TO DATE CULTURE WILL BE HELD FOR 5 DAYS BEFORE ISSUING A FINAL NEGATIVE REPORT     Performed at [facility]   Report Status PENDING   Incomplete  CULTURE, BLOOD (ROUTINE X 2)     Status: None   Collection Time    05/02/14  6:40 PM      Result Value Ref Range Status   Specimen Description BLOOD HAND RIGHT   Final   Special Requests BOTTLES DRAWN AEROBIC AND ANAEROBIC 10CC   Final   Culture  Setup Time     Final   Value: 05/02/2014 22:25     Performed at [facility]   Culture     Final   Value:        BLOOD CULTURE RECEIVED NO GROWTH TO DATE CULTURE WILL BE HELD FOR 5 DAYS BEFORE ISSUING A FINAL NEGATIVE REPORT     Performed at [facility]   Report Status PENDING   Incomplete  CULTURE, ROUTINE-ABSCESS     Status: None   Collection Time     05/02/14  7:25 PM  Result Value Ref Range Status   Specimen Description ABSCESS MOUTH   Final   Special Requests     Final   Value: PATIENT ON FOLLOWING UNASYN, CLEOCIN LEFT ORAL CAVITY   Gram Stain     Final   Value: MODERATE WBC PRESENT,BOTH PMN AND MONONUCLEAR     NO SQUAMOUS EPITHELIAL CELLS SEEN     FEW GRAM POSITIVE COCCI     IN PAIRS FEW GRAM VARIABLE ROD     Performed at [facility]   Culture     Final   Value: Culture reincubated for better growth     Performed at [facility]   Report Status PENDING   Incomplete  ANAEROBIC CULTURE     Status: None   Collection Time    05/02/14  7:25 PM      Result Value Ref Range Status   Specimen Description ABSCESS MOUTH   Final   Special Requests     Final   Value: PATIENT ON FOLLOWING UNASYN, CLEOCIN LEFT ORAL CAVITY   Gram Stain     Final   Value: MODERATE WBC PRESENT,BOTH PMN AND MONONUCLEAR     NO SQUAMOUS EPITHELIAL CELLS SEEN     FEW GRAM POSITIVE COCCI     IN PAIRS FEW GRAM VARIABLE ROD     Performed at [facility]   Culture     Final   Value: NO ANAEROBES ISOLATED; CULTURE IN PROGRESS FOR 5 DAYS     Performed at [facility]   Report Status PENDING   Incomplete     Labs: Basic Metabolic Panel:  Recent Labs Lab 05/02/14 1238 05/02/14 1825 05/03/14 0647  NA 137  --  131*  K 4.4  --  4.8  CL 97  --  95*  CO2 22  --  20  GLUCOSE 107*  --  151*  BUN 15  --  13  CREATININE 0.97 0.77 0.71  CALCIUM 10.5  --  10.3   Liver Function Tests:  Recent Labs Lab 05/02/14 1238 05/03/14 0647  AST 22 24  ALT 15 17  ALKPHOS 68 59  BILITOT 0.4 0.3  PROT 9.5* 8.7*  ALBUMIN 4.0 3.4*   No results found for this basename: LIPASE, AMYLASE,  in the last 168 hours No results found for this basename: AMMONIA,  in the last 168 hours CBC:  Recent Labs Lab 05/02/14 1238 05/02/14 1825 05/03/14 0647  WBC 6.8 6.7 7.2  NEUTROABS 3.6  --   --   HGB 16.8 15.0 15.1  HCT 48.6 42.6 42.0   MCV 93.3 91.0 90.7  PLT 321 290 327   Cardiac Enzymes: No results found for this basename: CKTOTAL, CKMB, CKMBINDEX, TROPONINI,  in the last 168 hours BNP: BNP (last 3 results) No results found for this basename: PROBNP,  in the last 8760 hours CBG: No results found for this basename: GLUCAP,  in the last 168 hours   Signed:  Lacy Duverney PA-C  [facility] 05/04/2014, 11:10 AM  Attending Patient was seen, examined,treatment plan was discussed with the Physician extender. I have directly reviewed the clinical findings, lab, imaging studies and management of this patient in detail. I have made the necessary changes to the above noted documentation, and agree with the documentation, as recorded by the Physician extender.  Nena Alexander MD [facility].

## 2014-05-04 NOTE — Care Management Note (Signed)
    Page 1 of 1   05/04/2014     12:10:17 PM CARE MANAGEMENT NOTE 05/04/2014  Patient:  [name], [name]   Account Number:  [id number]  Date Initiated:  05/04/2014  Documentation initiated by:  Tomi Bamberger  Subjective/Objective Assessment:   dx oral  abscess  admit- lives with grilfriend.  pta indep.     Action/Plan:   Anticipated DC Date:  05/04/2014   Anticipated DC Plan:  [place]  CM consult      Choice offered to / List presented to:             Status of service:  Completed, signed off Medicare Important Message given?  NO (If response is "NO", the following Medicare IM given date fields will be blank) Date Medicare IM given:   Medicare IM given by:   Date Additional Medicare IM given:   Additional Medicare IM given by:    Discharge Disposition:  HOME/SELF CARE  Per UR Regulation:  Reviewed for med. necessity/level of care/duration of stay  If discussed at [place] of Stay Meetings, dates discussed:    Comments:  05/04/14 [place], BSN [phone] patient dc today, no needs antiicpated.

## 2014-05-06 LAB — CULTURE, ROUTINE-ABSCESS

## 2014-05-07 LAB — ANAEROBIC CULTURE

## 2014-05-08 LAB — CULTURE, BLOOD (ROUTINE X 2)
Culture: NO GROWTH
Culture: NO GROWTH

## 2014-08-16 ENCOUNTER — Emergency Department (HOSPITAL_COMMUNITY): Payer: Medicaid Other

## 2014-08-16 ENCOUNTER — Encounter (HOSPITAL_COMMUNITY): Payer: Self-pay | Admitting: Family Medicine

## 2014-08-16 ENCOUNTER — Emergency Department (HOSPITAL_COMMUNITY)
Admission: EM | Admit: 2014-08-16 | Discharge: 2014-08-16 | Disposition: A | Discharge status: Home / Self Care | Payer: Medicaid Other | Attending: Emergency Medicine | Admitting: Emergency Medicine

## 2014-08-16 DIAGNOSIS — I10 Essential (primary) hypertension: Secondary | ICD-10-CM | POA: Insufficient documentation

## 2014-08-16 DIAGNOSIS — M545 Low back pain, unspecified: Secondary | ICD-10-CM

## 2014-08-16 DIAGNOSIS — S300XXA Contusion of lower back and pelvis, initial encounter: Secondary | ICD-10-CM | POA: Diagnosis not present

## 2014-08-16 DIAGNOSIS — Y9389 Activity, other specified: Secondary | ICD-10-CM | POA: Diagnosis not present

## 2014-08-16 DIAGNOSIS — Y998 Other external cause status: Secondary | ICD-10-CM | POA: Insufficient documentation

## 2014-08-16 DIAGNOSIS — S79911A Unspecified injury of right hip, initial encounter: Secondary | ICD-10-CM | POA: Diagnosis not present

## 2014-08-16 DIAGNOSIS — Z792 Long term (current) use of antibiotics: Secondary | ICD-10-CM | POA: Insufficient documentation

## 2014-08-16 DIAGNOSIS — G8929 Other chronic pain: Secondary | ICD-10-CM | POA: Insufficient documentation

## 2014-08-16 DIAGNOSIS — Y9289 Other specified places as the place of occurrence of the external cause: Secondary | ICD-10-CM | POA: Insufficient documentation

## 2014-08-16 DIAGNOSIS — Z720 Tobacco use: Secondary | ICD-10-CM | POA: Insufficient documentation

## 2014-08-16 DIAGNOSIS — Z79899 Other long term (current) drug therapy: Secondary | ICD-10-CM | POA: Insufficient documentation

## 2014-08-16 DIAGNOSIS — M25551 Pain in right hip: Secondary | ICD-10-CM

## 2014-08-16 DIAGNOSIS — W109XXA Fall (on) (from) unspecified stairs and steps, initial encounter: Secondary | ICD-10-CM | POA: Diagnosis not present

## 2014-08-16 DIAGNOSIS — S8991XA Unspecified injury of right lower leg, initial encounter: Secondary | ICD-10-CM | POA: Diagnosis present

## 2014-08-16 MED ORDER — OXYCODONE-ACETAMINOPHEN 5-325 MG PO TABS
2.0000 | ORAL_TABLET | Freq: Once | ORAL | Status: AC
  Administered 2014-08-16: 2 via ORAL
  Filled 2014-08-16: qty 2

## 2014-08-16 MED ORDER — OXYCODONE-ACETAMINOPHEN 5-325 MG PO TABS: 2.0000 | ORAL_TABLET | Freq: Four times a day (QID) | ORAL | Status: DC | PRN

## 2014-08-16 MED ORDER — DIAZEPAM 5 MG PO TABS
5.0000 mg | ORAL_TABLET | Freq: Once | ORAL | Status: AC
  Administered 2014-08-16: 5 mg via ORAL
  Filled 2014-08-16: qty 1

## 2014-08-16 NOTE — Discharge Instructions (Signed)
Arthralgia °Your caregiver has diagnosed you as suffering from an arthralgia. Arthralgia means there is pain in a joint. This can come from many reasons including: °· Bruising the joint which causes soreness (inflammation) in the joint. °· Wear and tear on the joints which occur as we grow older (osteoarthritis). °· Overusing the joint. °· Various forms of arthritis. °· Infections of the joint. °Regardless of the cause of pain in your joint, most of these different pains respond to anti-inflammatory drugs and rest. The exception to this is when a joint is infected, and these cases are treated with antibiotics, if it is a bacterial infection. °HOME CARE INSTRUCTIONS  °· Rest the injured area for as long as directed by your caregiver. Then slowly start using the joint as directed by your caregiver and as the pain allows. Crutches as directed may be useful if the ankles, knees or hips are involved. If the knee was splinted or casted, continue use and care as directed. If an stretchy or elastic wrapping bandage has been applied today, it should be removed and re-applied every 3 to 4 hours. It should not be applied tightly, but firmly enough to keep swelling down. Watch toes and feet for swelling, bluish discoloration, coldness, numbness or excessive pain. If any of these problems (symptoms) occur, remove the ace bandage and re-apply more loosely. If these symptoms persist, contact your caregiver or return to this location. °· For the first 24 hours, keep the injured extremity elevated on pillows while lying down. °· Apply ice for 15-20 minutes to the sore joint every couple hours while awake for the first half day. Then 03-04 times per day for the first 48 hours. Put the ice in a plastic bag and place a towel between the bag of ice and your skin. °· Wear any splinting, casting, elastic bandage applications, or slings as instructed. °· Only take over-the-counter or prescription medicines for pain, discomfort, or fever as  directed by your caregiver. Do not use aspirin immediately after the injury unless instructed by your physician. Aspirin can cause increased bleeding and bruising of the tissues. °· If you were given crutches, continue to use them as instructed and do not resume weight bearing on the sore joint until instructed. °Persistent pain and inability to use the sore joint as directed for more than 2 to 3 days are warning signs indicating that you should see a caregiver for a follow-up visit as soon as possible. Initially, a hairline fracture (break in bone) may not be evident on X-rays. Persistent pain and swelling indicate that further evaluation, non-weight bearing or use of the joint (use of crutches or slings as instructed), or further X-rays are indicated. X-rays may sometimes not show a small fracture until a week or 10 days later. Make a follow-up appointment with your own caregiver or one to whom we have referred you. A radiologist (specialist in reading X-rays) may read your X-rays. Make sure you know how you are to obtain your X-ray results. Do not assume everything is normal if you do not hear from us. °SEEK MEDICAL CARE IF: °Bruising, swelling, or pain increases. °SEEK IMMEDIATE MEDICAL CARE IF:  °· Your fingers or toes are numb or blue. °· The pain is not responding to medications and continues to stay the same or get worse. °· The pain in your joint becomes severe. °· You develop a fever over 102° F (38.9° C). °· It becomes impossible to move or use the joint. °MAKE SURE YOU:  °·   Understand these instructions. °· Will watch your condition. °· Will get help right away if you are not doing well or get worse. °Document Released: 08/25/2005 Document Revised: 11/17/2011 Document Reviewed: 04/12/2008 °ExitCare® Patient Information ©2015 [employer]. This information is not intended to replace advice given to you by your health care provider. Make sure you discuss any questions you have with your health care  provider. ° °Arthritis, Nonspecific °Arthritis is inflammation of a joint. This usually means pain, redness, warmth or swelling are present. One or more joints may be involved. There are a number of types of arthritis. Your caregiver may not be able to tell what type of arthritis you have right away. °CAUSES  °The most common cause of arthritis is the wear and tear on the joint (osteoarthritis). This causes damage to the cartilage, which can break down over time. The knees, hips, back and neck are most often affected by this type of arthritis. °Other types of arthritis and common causes of joint pain include: °· Sprains and other injuries near the joint. Sometimes minor sprains and injuries cause pain and swelling that develop hours later. °· Rheumatoid arthritis. This affects hands, feet and knees. It usually affects both sides of your body at the same time. It is often associated with chronic ailments, fever, weight loss and general weakness. °· Crystal arthritis. Gout and pseudo gout can cause occasional acute severe pain, redness and swelling in the foot, ankle, or knee. °· Infectious arthritis. Bacteria can get into a joint through a break in overlying skin. This can cause infection of the joint. Bacteria and viruses can also spread through the blood and affect your joints. °· Drug, infectious and allergy reactions. Sometimes joints can become mildly painful and slightly swollen with these types of illnesses. °SYMPTOMS  °· Pain is the main symptom. °· Your joint or joints can also be red, swollen and warm or hot to the touch. °· You may have a fever with certain types of arthritis, or even feel overall ill. °· The joint with arthritis will hurt with movement. Stiffness is present with some types of arthritis. °DIAGNOSIS  °Your caregiver will suspect arthritis based on your description of your symptoms and on your exam. Testing may be needed to find the type of arthritis: °· Blood and sometimes urine  tests. °· X-ray tests and sometimes CT or MRI scans. °· Removal of fluid from the joint (arthrocentesis) is done to check for bacteria, crystals or other causes. Your caregiver (or a specialist) will numb the area over the joint with a local anesthetic, and use a needle to remove joint fluid for examination. This procedure is only minimally uncomfortable. °· Even with these tests, your caregiver may not be able to tell what kind of arthritis you have. Consultation with a specialist (rheumatologist) may be helpful. °TREATMENT  °Your caregiver will discuss with you treatment specific to your type of arthritis. If the specific type cannot be determined, then the following general recommendations may apply. °Treatment of severe joint pain includes: °· Rest. °· Elevation. °· Anti-inflammatory medication (for example, ibuprofen) may be prescribed. Avoiding activities that cause increased pain. °· Only take over-the-counter or prescription medicines for pain and discomfort as recommended by your caregiver. °· Cold packs over an inflamed joint may be used for 10 to 15 minutes every hour. Hot packs sometimes feel better, but do not use overnight. Do not use hot packs if you are diabetic without your caregiver's permission. °· A cortisone shot into arthritic   joints may help reduce pain and swelling. °· Any acute arthritis that gets worse over the next 1 to 2 days needs to be looked at to be sure there is no joint infection. °Long-term arthritis treatment involves modifying activities and lifestyle to reduce joint stress jarring. This can include weight loss. Also, exercise is needed to nourish the joint cartilage and remove waste. This helps keep the muscles around the joint strong. °HOME CARE INSTRUCTIONS  °· Do not take aspirin to relieve pain if gout is suspected. This elevates uric acid levels. °· Only take over-the-counter or prescription medicines for pain, discomfort or fever as directed by your caregiver. °· Rest the  joint as much as possible. °· If your joint is swollen, keep it elevated. °· Use crutches if the painful joint is in your leg. °· Drinking plenty of fluids may help for certain types of arthritis. °· Follow your caregiver's dietary instructions. °· Try low-impact exercise such as: °¨ Swimming. °¨ Water aerobics. °¨ Biking. °¨ Walking. °· Morning stiffness is often relieved by a warm shower. °· Put your joints through regular range-of-motion. °SEEK MEDICAL CARE IF:  °· You do not feel better in 24 hours or are getting worse. °· You have side effects to medications, or are not getting better with treatment. °SEEK IMMEDIATE MEDICAL CARE IF:  °· You have a fever. °· You develop severe joint pain, swelling or redness. °· Many joints are involved and become painful and swollen. °· There is severe back pain and/or leg weakness. °· You have loss of bowel or bladder control. °Document Released: 10/02/2004 Document Revised: 11/17/2011 Document Reviewed: 10/18/2008 °ExitCare® Patient Information ©2015 [employer]. This information is not intended to replace advice given to you by your health care provider. Make sure you discuss any questions you have with your health care provider. ° °

## 2014-08-16 NOTE — ED Notes (Signed)
Pt sts that he fell about 45 min ago and felll on his right side. sts chronic back pain and re injured back. sts hurting in the right side.

## 2014-08-16 NOTE — ED Notes (Signed)
Pt ambulated in hall with no assistance needed. Reported to [place], [place]

## 2014-08-16 NOTE — ED Provider Notes (Signed)
CSN: [id number]     Arrival date & time 08/16/14  1115 History  This chart was scribed for non-physician practitioner, Montine Circle, PA-C working with [name], MD by [name], ED scribe. This patient was seen in room TR06C/TR06C and the patient's care was started at 11:56 AM.    Chief Complaint  Patient presents with  . Fall  . Back Pain  . Leg Pain   The history is provided by the patient. No language interpreter was used.    HPI Comments: [name] is a 58 y.o. male with history of chronic lower back pain due to spinal fusion who presents to the Emergency Department complaining of a fall that occurred about one hour ago. States he tripped and fell down four steps, landing on a concrete driveway onto his right leg. Denies LOC. Reports right leg pain, right neck pain and lower back pain. Certain movements worsen the pain. He has been able to ambulate since the fall. Pt has not yet taken any medications.   Past Medical History  Diagnosis Date  . Chronic back pain     S/P SPINAL FUSION - SOME INTERMITTEN NUMBNESS RT LEG AND CHRONIC PAIN AND STIFFNESS LUMBAR  . Hypertension    Past Surgical History  Procedure Laterality Date  . Back surgery      X 2 INCLUDING FUSION OF SPINE  . Colon surgery  2011    PARTIAL COLON RESECTION FOR CANCER  . Multiple extractions with alveoloplasty N/A 07/22/2013    Procedure: EXTRACTIONS WITH REMOVAL OF TORI AND REMOVAL OF EXOSTOSIS;  Surgeon: [name], DDS;  Location: [facility];  Service: Oral Surgery;  Laterality: N/A;  . Wisdom tooth extraction    . Tonsillectomy and adenoidectomy Left 05/02/2014    Procedure: Incision and and drainage of left oral cavity abscess;  Surgeon: [name], MD;  Location: [facility];  Service: ENT;  Laterality: Left;   History reviewed. No pertinent family history. History  Substance Use Topics  . Smoking status: Current Every Day Smoker -- 0.25 packs/day for 30 years    Types: Cigarettes  .  Smokeless tobacco: Never Used  . Alcohol Use: Yes     Comment: occasional    Review of Systems  Constitutional: Negative for fever.  HENT: Negative for congestion.   Eyes: Negative for redness.  Respiratory: Negative for shortness of breath.   Cardiovascular: Negative for chest pain.  Gastrointestinal: Negative for abdominal distention.  Musculoskeletal: Positive for myalgias, back pain and neck pain.  Skin: Negative for rash.  Neurological: Negative for speech difficulty.  Psychiatric/Behavioral: Negative for confusion.   Allergies  Vicodin  Home Medications   Prior to Admission medications   Medication Sig Start Date End Date Taking? Authorizing Provider  amLODipine (NORVASC) 10 MG tablet Take 10 mg by mouth every morning.     Historical Provider, MD  carvedilol (COREG) 3.125 MG tablet Take 3.125 mg by mouth 2 (two) times daily with a meal.    Historical Provider, MD  chlorhexidine (PERIDEX) 0.12 % solution Use as directed 10 mLs in the mouth or throat 4 (four) times daily. 05/04/14   Shanker Kristeen Mans, MD  Choline Fenofibrate (TRILIPIX) 135 MG capsule Take 135 mg by mouth every morning.     Historical Provider, MD  clindamycin (CLEOCIN) 300 MG capsule Take 1 capsule (300 mg total) by mouth 3 (three) times daily. 05/04/14   Shanker Kristeen Mans, MD  oxyCODONE (OXY IR/ROXICODONE) 5 MG immediate release tablet Take  1 tablet (5 mg total) by mouth every 6 (six) hours as needed for moderate pain. 05/04/14   Shanker Kristeen Mans, MD   BP 122/87 mmHg  Pulse 76  Temp(Src) 98.6 F (37 C)  Resp 18  Wt 180 lb (81.647 kg)  SpO2 98%   Physical Exam  Constitutional: He is oriented to person, place, and time. He appears well-developed. No distress.  HENT:  Head: Normocephalic and atraumatic.  Eyes: Conjunctivae and EOM are normal.  Cardiovascular: Normal rate and regular rhythm.   Pulmonary/Chest: Effort normal. No stridor. No respiratory distress.  Abdominal: He exhibits no distension.   Musculoskeletal: He exhibits no edema.  Right hip mildly tender to palpation. No obvious bony deformity or abnormality. ROM and strength is 5/5. Pt is ambulatory.   Neurological: He is alert and oriented to person, place, and time.  Skin: Skin is warm and dry.  Faint, small contusion to the right buttock  Psychiatric: He has a normal mood and affect.  Nursing note and vitals reviewed.   ED Course  Procedures (including critical care time)  DIAGNOSTIC STUDIES: Oxygen Saturation is 98% on RA, normal by my interpretation.    COORDINATION OF CARE: 11:59 AM-Discussed treatment plan which includes back xray, hip xray, percocet and valium with pt at bedside and pt agreed to plan.   Labs Review Labs Reviewed - No data to display  Imaging Review Dg Lumbar Spine Complete  08/16/2014   CLINICAL DATA:  58 year old male status post fall from steps onto concrete with right side pain. Initial encounter.  EXAM: LUMBAR SPINE - COMPLETE 4+ VIEW  COMPARISON:  CT Abdomen and Pelvis 02/22/2014 and earlier, including lumbar spine series 930 2010.  FINDINGS: Normal lumbar segmentation. Sequelae of L4-L5 and L5-S1 decompression and posterior and interbody fusion. Hardware appears stable and intact. Posterior fusion mass re- identified. Stable alignment at L3-L4 with moderate to severe posterior element hypertrophy. That facet degeneration as well as moderate to severe facet arthropathy at L2-L3 has progressed since 2010. Stable lumbar and visible lower thoracic vertebral height and alignment elsewhere. Sacral ala and SI joints are stable. Chronic Aortoiliac calcified atherosclerosis noted.  IMPRESSION: No acute osseous abnormality identified. Stable postoperative appearance of the lumbar spine since 2010 except for progressive facet arthropathy from L2 to L4.   Electronically Signed   By: Lars Pinks M.D.   On: 08/16/2014 13:18   Dg Hip Complete Right  08/16/2014   CLINICAL DATA:  58 year old male status post fall  from steps onto concrete with right side pain. Initial encounter.  EXAM: RIGHT HIP - COMPLETE 2+ VIEW  COMPARISON:  05/02/2012. Lumbar series from today reported separately.  FINDINGS: Femoral heads remain normally located. Hip joint spaces are preserved. Pelvis is intact. Sacrum appears intact. Proximal left femur grossly intact. Proximal right femur stable and intact.  IMPRESSION: No acute fracture or dislocation identified about the right hip or pelvis.   Electronically Signed   By: Lars Pinks M.D.   On: 08/16/2014 13:19     EKG Interpretation None      MDM   Final diagnoses:  Low back pain  Right hip pain    Patient with back pain.  No neurological deficits and normal neuro exam.  Patient is ambulatory.  No loss of bowel or bladder control.  Doubt cauda equina.  Denies fever,  doubt epidural abscess or other lesion. Recommend back exercises, stretching, RICE, and will treat with a short course of percocet.  Encouraged the patient that  there could be a need for additional workup and/or imaging such as MRI, if the symptoms do not resolve. Patient advised that if the back pain does not resolve, or radiates, this could progress to more serious conditions and is encouraged to follow-up with PCP or orthopedics within 2 weeks.     I personally performed the services described in this documentation, which was scribed in my presence. The recorded information has been reviewed and is accurate.  Montine Circle, PA-C 08/16/14 [place], MD 08/17/14 [phone]

## 2014-09-04 ENCOUNTER — Other Ambulatory Visit: Payer: Self-pay | Admitting: Physician Assistant

## 2014-09-04 DIAGNOSIS — R112 Nausea with vomiting, unspecified: Secondary | ICD-10-CM

## 2014-09-04 DIAGNOSIS — R109 Unspecified abdominal pain: Secondary | ICD-10-CM

## 2014-09-04 DIAGNOSIS — D490 Neoplasm of unspecified behavior of digestive system: Secondary | ICD-10-CM

## 2014-09-04 DIAGNOSIS — Z8719 Personal history of other diseases of the digestive system: Secondary | ICD-10-CM

## 2014-09-07 ENCOUNTER — Ambulatory Visit
Admission: RE | Admit: 2014-09-07 | Discharge: 2014-09-07 | Disposition: A | Discharge status: Home / Self Care | Payer: Medicaid Other | Source: Ambulatory Visit | Attending: Physician Assistant | Admitting: Physician Assistant

## 2014-09-07 DIAGNOSIS — R112 Nausea with vomiting, unspecified: Secondary | ICD-10-CM

## 2014-09-07 DIAGNOSIS — R109 Unspecified abdominal pain: Secondary | ICD-10-CM

## 2014-09-07 DIAGNOSIS — Z8719 Personal history of other diseases of the digestive system: Secondary | ICD-10-CM

## 2014-09-07 DIAGNOSIS — D490 Neoplasm of unspecified behavior of digestive system: Secondary | ICD-10-CM

## 2014-11-01 ENCOUNTER — Other Ambulatory Visit: Payer: Self-pay | Admitting: Gastroenterology

## 2015-03-29 ENCOUNTER — Other Ambulatory Visit: Payer: Self-pay

## 2015-03-29 DIAGNOSIS — R1084 Generalized abdominal pain: Secondary | ICD-10-CM

## 2015-03-29 NOTE — Addendum Note (Signed)
Addended by: Redmond Pulling, Kylin Dubs M on: 03/29/2015 05:17 PM   Modules accepted: Orders

## 2015-03-30 ENCOUNTER — Other Ambulatory Visit: Payer: Self-pay

## 2015-03-30 ENCOUNTER — Other Ambulatory Visit: Payer: Self-pay | Admitting: General Surgery

## 2015-03-30 DIAGNOSIS — C7A019 Malignant carcinoid tumor of the small intestine, unspecified portion: Secondary | ICD-10-CM

## 2015-03-30 DIAGNOSIS — Z859 Personal history of malignant neoplasm, unspecified: Secondary | ICD-10-CM

## 2015-03-30 NOTE — Addendum Note (Signed)
Addended by: Redmond Pulling, Mercia Dowe M on: 03/30/2015 05:16 PM   Modules accepted: Orders

## 2015-04-04 ENCOUNTER — Inpatient Hospital Stay: Admission: RE | Admit: 2015-04-04 | Payer: Medicaid Other | Source: Ambulatory Visit

## 2015-04-18 ENCOUNTER — Ambulatory Visit
Admission: RE | Admit: 2015-04-18 | Discharge: 2015-04-18 | Disposition: A | Discharge status: Home / Self Care | Payer: Medicaid Other | Source: Ambulatory Visit | Attending: General Surgery | Admitting: General Surgery

## 2015-04-18 DIAGNOSIS — D3A020 Benign carcinoid tumor of the appendix: Secondary | ICD-10-CM

## 2015-04-19 ENCOUNTER — Other Ambulatory Visit: Payer: Medicaid Other

## 2015-11-05 ENCOUNTER — Encounter (HOSPITAL_COMMUNITY): Payer: Self-pay | Admitting: *Deleted

## 2015-11-05 ENCOUNTER — Emergency Department (HOSPITAL_COMMUNITY): Payer: Medicaid Other

## 2015-11-05 ENCOUNTER — Emergency Department (HOSPITAL_COMMUNITY)
Admission: EM | Admit: 2015-11-05 | Discharge: 2015-11-05 | Disposition: A | Discharge status: Home / Self Care | Payer: Medicaid Other | Attending: Emergency Medicine | Admitting: Emergency Medicine

## 2015-11-05 DIAGNOSIS — F17210 Nicotine dependence, cigarettes, uncomplicated: Secondary | ICD-10-CM | POA: Diagnosis not present

## 2015-11-05 DIAGNOSIS — I10 Essential (primary) hypertension: Secondary | ICD-10-CM | POA: Insufficient documentation

## 2015-11-05 DIAGNOSIS — M4682 Other specified inflammatory spondylopathies, cervical region: Secondary | ICD-10-CM | POA: Insufficient documentation

## 2015-11-05 DIAGNOSIS — I6789 Other cerebrovascular disease: Secondary | ICD-10-CM

## 2015-11-05 DIAGNOSIS — I951 Orthostatic hypotension: Secondary | ICD-10-CM

## 2015-11-05 DIAGNOSIS — S199XXA Unspecified injury of neck, initial encounter: Secondary | ICD-10-CM | POA: Insufficient documentation

## 2015-11-05 DIAGNOSIS — Y998 Other external cause status: Secondary | ICD-10-CM | POA: Diagnosis not present

## 2015-11-05 DIAGNOSIS — Z792 Long term (current) use of antibiotics: Secondary | ICD-10-CM | POA: Insufficient documentation

## 2015-11-05 DIAGNOSIS — R4020 Unspecified coma: Secondary | ICD-10-CM

## 2015-11-05 DIAGNOSIS — Z79899 Other long term (current) drug therapy: Secondary | ICD-10-CM | POA: Diagnosis not present

## 2015-11-05 DIAGNOSIS — W1789XA Other fall from one level to another, initial encounter: Secondary | ICD-10-CM | POA: Insufficient documentation

## 2015-11-05 DIAGNOSIS — Y9389 Activity, other specified: Secondary | ICD-10-CM | POA: Insufficient documentation

## 2015-11-05 DIAGNOSIS — G8929 Other chronic pain: Secondary | ICD-10-CM | POA: Insufficient documentation

## 2015-11-05 DIAGNOSIS — G44319 Acute post-traumatic headache, not intractable: Secondary | ICD-10-CM

## 2015-11-05 DIAGNOSIS — I672 Cerebral atherosclerosis: Secondary | ICD-10-CM | POA: Insufficient documentation

## 2015-11-05 DIAGNOSIS — M542 Cervicalgia: Secondary | ICD-10-CM

## 2015-11-05 DIAGNOSIS — Y9289 Other specified places as the place of occurrence of the external cause: Secondary | ICD-10-CM | POA: Insufficient documentation

## 2015-11-05 DIAGNOSIS — M62838 Other muscle spasm: Secondary | ICD-10-CM

## 2015-11-05 DIAGNOSIS — S0990XA Unspecified injury of head, initial encounter: Secondary | ICD-10-CM | POA: Diagnosis not present

## 2015-11-05 DIAGNOSIS — R55 Syncope and collapse: Secondary | ICD-10-CM | POA: Diagnosis present

## 2015-11-05 DIAGNOSIS — M47812 Spondylosis without myelopathy or radiculopathy, cervical region: Secondary | ICD-10-CM

## 2015-11-05 DIAGNOSIS — I679 Cerebrovascular disease, unspecified: Secondary | ICD-10-CM

## 2015-11-05 LAB — URINALYSIS, ROUTINE W REFLEX MICROSCOPIC
Bilirubin Urine: NEGATIVE
GLUCOSE, UA: NEGATIVE mg/dL
Hgb urine dipstick: NEGATIVE
Ketones, ur: NEGATIVE mg/dL
LEUKOCYTES UA: NEGATIVE
Nitrite: NEGATIVE
PH: 6.5 (ref 5.0–8.0)
Protein, ur: NEGATIVE mg/dL
Specific Gravity, Urine: 1.022 (ref 1.005–1.030)

## 2015-11-05 LAB — CBC WITH DIFFERENTIAL/PLATELET
BASOS ABS: 0 10*3/uL (ref 0.0–0.1)
BASOS PCT: 1 %
EOS ABS: 0.2 10*3/uL (ref 0.0–0.7)
EOS PCT: 7 %
HCT: 39.1 % (ref 39.0–52.0)
HEMOGLOBIN: 13.2 g/dL (ref 13.0–17.0)
LYMPHS ABS: 1.9 10*3/uL (ref 0.7–4.0)
Lymphocytes Relative: 51 %
MCH: 32.6 pg (ref 26.0–34.0)
MCHC: 33.8 g/dL (ref 30.0–36.0)
MCV: 96.5 fL (ref 78.0–100.0)
Monocytes Absolute: 0.4 10*3/uL (ref 0.1–1.0)
Monocytes Relative: 11 %
Neutro Abs: 1.1 10*3/uL — ABNORMAL LOW (ref 1.7–7.7)
Neutrophils Relative %: 30 %
Platelets: 213 10*3/uL (ref 150–400)
RBC: 4.05 MIL/uL — ABNORMAL LOW (ref 4.22–5.81)
RDW: 12.6 % (ref 11.5–15.5)
WBC: 3.6 10*3/uL — ABNORMAL LOW (ref 4.0–10.5)

## 2015-11-05 LAB — BASIC METABOLIC PANEL
Anion gap: 12 (ref 5–15)
BUN: 8 mg/dL (ref 6–20)
CHLORIDE: 110 mmol/L (ref 101–111)
CO2: 18 mmol/L — AB (ref 22–32)
Calcium: 9.5 mg/dL (ref 8.9–10.3)
Creatinine, Ser: 0.78 mg/dL (ref 0.61–1.24)
GFR calc Af Amer: >60 mL/min (ref >60)
GFR calc non Af Amer: >60 mL/min (ref >60)
Glucose, Bld: 94 mg/dL (ref 65–99)
Potassium: 4 mmol/L (ref 3.5–5.1)
Sodium: 140 mmol/L (ref 135–145)

## 2015-11-05 LAB — I-STAT TROPONIN, ED: Troponin i, poc: 0 ng/mL (ref 0.00–0.08)

## 2015-11-05 LAB — APTT: aPTT: 27 seconds (ref 24–37)

## 2015-11-05 LAB — PROTIME-INR
INR: 1.06 (ref 0.00–1.49)
Prothrombin Time: 14 seconds (ref 11.6–15.2)

## 2015-11-05 MED ORDER — SODIUM CHLORIDE 0.9 % IV BOLUS (SEPSIS)
1000.0000 mL | Freq: Once | INTRAVENOUS | Status: AC
  Administered 2015-11-05: 1000 mL via INTRAVENOUS

## 2015-11-05 MED ORDER — HYDROMORPHONE HCL 1 MG/ML IJ SOLN
0.5000 mg | Freq: Once | INTRAMUSCULAR | Status: AC
  Administered 2015-11-05: 0.5 mg via INTRAVENOUS
  Filled 2015-11-05: qty 1

## 2015-11-05 NOTE — ED Provider Notes (Signed)
CSN: [id number]     Arrival date & time 11/05/15  0754 History   First MD Initiated Contact with Patient 11/05/15 [phone]     Chief Complaint  Patient presents with  . Loss of Consciousness  . Dizziness     (Consider location/radiation/quality/duration/timing/severity/associated sxs/prior Treatment) HPI Comments: [name] is a 60 y.o. male with a PMHx of HTN, chronic back pain s/p spinal fusion with residual R leg numbness, and carcinoid tumor of appendix s/p hemicolectomy, who presents to the ED with complaints of loss of consciousness on Saturday. Patient states he was sitting on the tailgate of a truck eating his meal while at work, states that he felt lightheaded and subsequently lost consciousness for an unknown amount of time, falling down from the tailgate hitting his head on the grass below, which was a witnessed fall. He states that he awoke shortly after, went home, and did not seek medical attention. He states he has never had a loss of consciousness episode, denies seizure-like activity or prior history of seizures. He states that since the accident he has had a throbbing generalized headache and continues to have "a little" lightheadedness but not to the degree that he felt on Saturday. Denies vertiginous dizziness. Complains of 9/10 right lateral neck pain that radiates into his head and right shoulder, constant and aching, worse with movement of his neck, and unimproved with Tylenol.  He denies any ear pain or drainage, tinnitus, vision changes, fevers, chills, chest pain, palpitations, shortness of breath, abdominal pain, nausea, vomiting, diarrhea, constipation, hematochezia, melena, dysuria, hematuria, numbness, tingling, or focal weakness. Denies being on any blood thinners. Denies any recent changes in his medications.  Patient is a 60 y.o. male presenting with syncope and dizziness. The history is provided by the patient and medical records. No language interpreter was used.   Loss of Consciousness Episode history:  Single Most recent episode:  2 days ago Duration: unknown. Timing:  Rare Progression:  Resolved Chronicity:  New Context: sitting down   Witnessed: yes   Relieved by:  None tried Worsened by:  Nothing tried Ineffective treatments:  None tried Associated symptoms: dizziness and headaches   Associated symptoms: no chest pain, no confusion, no fever, no focal sensory loss, no focal weakness, no nausea, no palpitations, no recent injury, no rectal bleeding, no seizures, no shortness of breath, no visual change, no vomiting and no weakness   Risk factors: no seizures   Dizziness Associated symptoms: headaches and syncope   Associated symptoms: no blood in stool, no chest pain, no diarrhea, no nausea, no palpitations, no shortness of breath, no tinnitus, no vision changes, no vomiting and no weakness     Past Medical History  Diagnosis Date  . Chronic back pain     S/P SPINAL FUSION - SOME INTERMITTEN NUMBNESS RT LEG AND CHRONIC PAIN AND STIFFNESS LUMBAR  . Hypertension    Past Surgical History  Procedure Laterality Date  . Back surgery      X 2 INCLUDING FUSION OF SPINE  . Colon surgery  2011    PARTIAL COLON RESECTION FOR CANCER  . Multiple extractions with alveoloplasty N/A 07/22/2013    Procedure: EXTRACTIONS WITH REMOVAL OF TORI AND REMOVAL OF EXOSTOSIS;  Surgeon: [name], DDS;  Location: [facility];  Service: Oral Surgery;  Laterality: N/A;  . Wisdom tooth extraction    . Tonsillectomy and adenoidectomy Left 05/02/2014    Procedure: Incision and and drainage of left oral cavity abscess;  Surgeon:  [name], MD;  Location: [facility];  Service: ENT;  Laterality: Left;   History reviewed. No pertinent family history. Social History  Substance Use Topics  . Smoking status: Current Every Day Smoker -- 0.25 packs/day for 30 years    Types: Cigarettes  . Smokeless tobacco: Never Used  . Alcohol Use: Yes     Comment: occasional     Review of Systems  Constitutional: Negative for fever and chills.  HENT: Positive for facial swelling (head injury). Negative for ear discharge, ear pain and tinnitus.   Eyes: Negative for visual disturbance.  Respiratory: Negative for shortness of breath.   Cardiovascular: Positive for syncope. Negative for chest pain and palpitations.  Gastrointestinal: Negative for nausea, vomiting, abdominal pain, diarrhea, constipation and blood in stool.  Genitourinary: Negative for dysuria and hematuria.  Musculoskeletal: Positive for neck pain. Negative for myalgias, back pain and arthralgias.  Skin: Negative for color change and wound.  Allergic/Immunologic: Negative for immunocompromised state.  Neurological: Positive for dizziness, syncope, light-headedness and headaches. Negative for focal weakness, seizures, weakness and numbness.  Hematological: Does not bruise/bleed easily.  Psychiatric/Behavioral: Negative for confusion.   10 Systems reviewed and are negative for acute change except as noted in the HPI.    Allergies  Vicodin  Home Medications   Prior to Admission medications   Medication Sig Start Date End Date Taking? Authorizing Provider  amLODipine (NORVASC) 10 MG tablet Take 10 mg by mouth every morning.     Historical Provider, MD  carvedilol (COREG) 3.125 MG tablet Take 3.125 mg by mouth 2 (two) times daily with a meal.    Historical Provider, MD  chlorhexidine (PERIDEX) 0.12 % solution Use as directed 10 mLs in the mouth or throat 4 (four) times daily. 05/04/14   Shanker Kristeen Mans, MD  Choline Fenofibrate (TRILIPIX) 135 MG capsule Take 135 mg by mouth every morning.     Historical Provider, MD  clindamycin (CLEOCIN) 300 MG capsule Take 1 capsule (300 mg total) by mouth 3 (three) times daily. 05/04/14   Shanker Kristeen Mans, MD  oxyCODONE (OXY IR/ROXICODONE) 5 MG immediate release tablet Take 1 tablet (5 mg total) by mouth every 6 (six) hours as needed for moderate pain. 05/04/14    Shanker Kristeen Mans, MD  oxyCODONE-acetaminophen (PERCOCET/ROXICET) 5-325 MG per tablet Take 2 tablets by mouth every 6 (six) hours as needed for severe pain. 08/16/14   Montine Circle, PA-C   BP 124/82 mmHg  Temp(Src) 98.2 F (36.8 C) (Oral)  Resp 18  Ht 6' (1.829 m)  Wt 77.111 kg  BMI 23.05 kg/m2  SpO2 99% Physical Exam  Constitutional: He is oriented to person, place, and time. Vital signs are normal. He appears well-developed and well-nourished.  Non-toxic appearance. No distress.  Afebrile, nontoxic, NAD  HENT:  Head: Normocephalic and atraumatic. Head is without raccoon's eyes, without Battle's sign, without abrasion, without contusion and without laceration.  Nose: Nose normal.  Mouth/Throat: Uvula is midline, oropharynx is clear and moist and mucous membranes are normal. No trismus in the jaw. No uvula swelling.  No scalp crepitus or tenderness, no abrasions or contusions, no battle's sign or raccoon eyes. Nose clear. Oropharynx clear and moist, uvula midline  Eyes: Conjunctivae and EOM are normal. Pupils are equal, round, and reactive to light. Right eye exhibits no discharge. Left eye exhibits no discharge.  PERRL, EOMI, no nystagmus, no visual field deficits   Neck: Normal range of motion. Neck supple. Spinous process tenderness and muscular tenderness present.  No rigidity. Normal range of motion present.    FROM intact with mild diffuse midline spinous process TTP, no bony stepoffs or deformities, with mild R sided paraspinous muscle TTP extending into the trapezius with palpable muscle spasms. No rigidity or meningeal signs. No bruising or swelling.   Cardiovascular: Normal rate, regular rhythm, normal heart sounds and intact distal pulses.  Exam reveals no gallop and no friction rub.   No murmur heard. Pulmonary/Chest: Effort normal and breath sounds normal. No respiratory distress. He has no decreased breath sounds. He has no wheezes. He has no rhonchi. He has no rales.    Abdominal: Soft. Normal appearance and bowel sounds are normal. He exhibits no distension. There is no tenderness. There is no rigidity, no rebound, no guarding, no CVA tenderness, no tenderness at McBurney's point and negative Murphy's sign.  Musculoskeletal: Normal range of motion.  MAE x4 Strength and sensation grossly intact Distal pulses intact Gait steady and nonataxic C-spine as above All other spinal levels nonTTP without step offs or deformities  Neurological: He is alert and oriented to person, place, and time. He has normal strength. No cranial nerve deficit or sensory deficit. Coordination and gait normal. GCS eye subscore is 4. GCS verbal subscore is 5. GCS motor subscore is 6.  CN 2-12 grossly intact A&O x4 GCS 15 Sensation and strength intact Gait nonataxic including with tandem walking Coordination with finger-to-nose WNL Neg pronator drift   Skin: Skin is warm, dry and intact. No rash noted.  Psychiatric: He has a normal mood and affect.  Nursing note and vitals reviewed.   ED Course  Procedures (including critical care time)  08:45:53 Orthostatic Vital Signs HD  Orthostatic Lying  - BP- Lying: 129/86 mmHg ; Pulse- Lying: 62  Orthostatic Sitting - BP- Sitting: 142/93 mmHg ; Pulse- Sitting: 58  Orthostatic Standing at 0 minutes - BP- Standing at 0 minutes: 123/103 mmHg ; Pulse- Standing at 0 minutes: 69       Labs Review Labs Reviewed  CBC WITH DIFFERENTIAL/PLATELET - Abnormal; Notable for the following:    WBC 3.6 (*)    RBC 4.05 (*)    Neutro Abs 1.1 (*)    All other components within normal limits  BASIC METABOLIC PANEL - Abnormal; Notable for the following:    CO2 18 (*)    All other components within normal limits  URINE CULTURE  APTT  PROTIME-INR  URINALYSIS, ROUTINE W REFLEX MICROSCOPIC (NOT AT [facility])  I-STAT TROPOININ, ED    Imaging Review Ct Head Wo Contrast  11/05/2015  CLINICAL DATA:  Syncope with a fall and blow to the head 0 2/ 25/2017.  Dizziness today. Initial encounter. EXAM: CT HEAD WITHOUT CONTRAST CT CERVICAL SPINE WITHOUT CONTRAST TECHNIQUE: Multidetector CT imaging of the head and cervical spine was performed following the standard protocol without intravenous contrast. Multiplanar CT image reconstructions of the cervical spine were also generated. COMPARISON:  None. FINDINGS: CT HEAD FINDINGS There is cortical atrophy and some chronic microvascular ischemic change. No evidence of acute intracranial abnormality including hemorrhage, infarct, mass lesion, mass effect, midline shift or abnormal extra-axial fluid collection is identified. Imaged paranasal sinuses and mastoid air cells are clear. The calvarium is intact. CT CERVICAL SPINE FINDINGS No cervical spine fracture or or malalignment is identified. Loss of disc space height and endplate spurring are seen at C5-6 and C6-7. Scattered facet degenerative change appears worst on the left at C7-T1. Lung apices demonstrate emphysematous change. IMPRESSION: No acute abnormality head  or cervical spine. Atrophy and chronic microvascular ischemic change. Cervical spondylosis. Emphysema. Electronically Signed   By: Inge Rise M.D.   On: 11/05/2015 09:59   Ct Cervical Spine Wo Contrast  11/05/2015  CLINICAL DATA:  Syncope with a fall and blow to the head 0 2/ 25/2017. Dizziness today. Initial encounter. EXAM: CT HEAD WITHOUT CONTRAST CT CERVICAL SPINE WITHOUT CONTRAST TECHNIQUE: Multidetector CT imaging of the head and cervical spine was performed following the standard protocol without intravenous contrast. Multiplanar CT image reconstructions of the cervical spine were also generated. COMPARISON:  None. FINDINGS: CT HEAD FINDINGS There is cortical atrophy and some chronic microvascular ischemic change. No evidence of acute intracranial abnormality including hemorrhage, infarct, mass lesion, mass effect, midline shift or abnormal extra-axial fluid collection is identified. Imaged paranasal  sinuses and mastoid air cells are clear. The calvarium is intact. CT CERVICAL SPINE FINDINGS No cervical spine fracture or or malalignment is identified. Loss of disc space height and endplate spurring are seen at C5-6 and C6-7. Scattered facet degenerative change appears worst on the left at C7-T1. Lung apices demonstrate emphysematous change. IMPRESSION: No acute abnormality head or cervical spine. Atrophy and chronic microvascular ischemic change. Cervical spondylosis. Emphysema. Electronically Signed   By: Inge Rise M.D.   On: 11/05/2015 09:59   I have personally reviewed and evaluated these images and lab results as part of my medical decision-making.   EKG Interpretation   Date/Time:  Monday November 05 2015 08:19:35 EST Ventricular Rate:  55 PR Interval:  260 QRS Duration: 94 QT Interval:  452 QTC Calculation: 432 R Axis:   -72 Text Interpretation:  Sinus rhythm Prolonged PR interval Consider left  atrial enlargement Left axis deviation Repol abnrm suggests ischemia,  anterolateral Confirmed by ZACKOWSKI  MD, SCOTT ([id number]) on 11/05/2015  8:24:06 AM      MDM   Final diagnoses:  LOC (loss of consciousness)  Head injury, initial encounter  Acute post-traumatic headache, not intractable  Orthostasis  Neck pain  Neck muscle spasm  Cervical spondyloarthritis  Cerebral microvasculopathy  Syncope, unspecified syncope type    60 y.o. male here with LOC 2 days ago and hit his head, complaining of headache and slight lightheadedness since the accident. Also complains of neck pain. On exam, no focal neuro deficits, NVI in all extremities, gait steady, with mild tenderness to cervical spine and R paraspinous muscles, slight spasm in trapezius. No scalp crepitus or tenderness. No raccoon eyes or battle's sign. Pt not on blood thinners. Given the odd story of unexplained LOC, will proceed with labs, head/neck imaging, and orthostatic VS and EKG. Will give fluids and pain meds. Will  reassess shortly.   10:34 AM  Pt feeling slightly better. Orthostatics somewhat positive with drop of 44mmHg in SBP when going from sitting to standing. No reflex tachycardia noted. Trop neg, EKG without acute ischemic changes or changes from prior EKGs. CBC w/diff unremarkable. aPTT and INR WNL. BMP WNL. CT head/neck without acute findings, does show some chronic microvascular ischemic changes and cervical spondylosis but otherwise unremarkable. U/A unremarkable. Overall, unconcerning exam and lab work. Given that his LOC episode was 2 days ago, and he is clinically well, I doubt he needs admission for observation. Unclear his etiology, it could have been from slight dehydration given that today he was slightly orthostatic, but I feel he could f/up with his outpatient provider for further work up. Discussed mental rest and post-concussion remedies for headache. Use tylenol/motrin for pain, and ice use  to his head with heat to his neck. Has home pain meds. F/up with PCP in 1-2 days for ongoing management and work up. I explained the diagnosis and have given explicit precautions to return to the ER including for any other new or worsening symptoms. The patient understands and accepts the medical plan as it's been dictated and I have answered their questions. Discharge instructions concerning home care and prescriptions have been given. The patient is STABLE and is discharged to home in good condition.  BP 124/82 mmHg  Temp(Src) 98.2 F (36.8 C) (Oral)  Resp 18  Ht 6' (1.829 m)  Wt 77.111 kg  BMI 23.05 kg/m2  SpO2 99%  Meds ordered this encounter  Medications  . sodium chloride 0.9 % bolus 1,000 mL    Sig:   . HYDROmorphone (DILAUDID) injection 0.5 mg    SigZacarias Pontes, PA-C 11/05/15 1042  [name], MD 11/09/15 1621

## 2015-11-05 NOTE — ED Notes (Signed)
Pt reports he passed out at work on SAT. Pt reports hitting his head and is dizzy now.

## 2015-11-05 NOTE — Discharge Instructions (Signed)
Your work up today is reassuring. 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 Use Ibuprofen or Tylenol for pain. Get plenty of rest, use ice on your head. Use heat to the areas of soreness on your neck. Stay in a quiet, not simulating, dark environment. No TV, computer use, video games, or cell phone use until headache is resolved completely. Follow Up with primary care physician in 1-2 days for ongoing management and evaluation after your episode of loss of consciousness.  Return to the emergency department if patient becomes lethargic, begins vomiting or other change in mental status.    Concussion, Adult A concussion is a brain injury. It is caused by:  A hit to the head.  A quick and sudden movement (jolt) of the head or neck. A concussion is usually not life threatening. Even so, it can cause serious problems. If you had a concussion before, you may have concussion-like problems after a hit to your head. HOME CARE General Instructions  Follow your doctor's directions carefully.  Take medicines only as told by your doctor.  Only take medicines your doctor says are safe.  Do not drink alcohol until your doctor says it is okay. Alcohol and some drugs can slow down healing. They can also put you at risk for further injury.  If you are having trouble remembering things, write them down.  Try to do one thing at a time if you get distracted easily. For example, do not watch TV while making dinner.  Talk to your family members or close friends when making important decisions.  Follow up with your doctor as told.  Watch your symptoms. Tell others to do the same. Serious problems can sometimes happen after a concussion. Older adults are more likely to have these problems.  Tell your teachers, school nurse, school counselor, coach, Product/process development scientist, or work Freight forwarder about your concussion.  Tell them about what you can or cannot do. They should watch to see if:  It gets even harder for you to pay attention or concentrate.  It gets even harder for you to remember things or learn new things.  You need more time than normal to finish things.  You become annoyed (irritable) more than before.  You are not able to deal with stress as well.  You have more problems than before.  Rest. Make sure you:  Get plenty of sleep at night.  Go to sleep early.  Go to bed at the same time every day. Try to wake up at the same time.  Rest during the day.  Take naps when you feel tired.  Limit activities where you have to think a lot or concentrate. These include:  Doing homework.  Doing work related to a job.  Watching TV.  Using the computer. Returning To Your Regular Activities Return to your normal activities slowly, not all at once. You must give your body and brain enough time to heal.   Do not play sports or do other athletic activities until your doctor says it is okay.  Ask your doctor when you can drive, ride a bicycle, or work other vehicles or machines. Never do these things if you feel dizzy.  Ask your doctor about when you can return to work or school. Preventing Another Concussion It is very important to avoid another brain injury, especially before you have healed. In rare cases,  another injury can lead to permanent brain damage, brain swelling, or death. The risk of this is greatest during the first 7-10 days after your injury. Avoid injuries by:   Wearing a seat belt when riding in a car.  Not drinking too much alcohol.  Avoiding activities that could lead to a second concussion (such as contact sports).  Wearing a helmet when doing activities like:  Biking.  Skiing.  Skateboarding.  Skating.  Making your home safer by:  Removing things from the floor or stairways that could make you trip.  Using grab bars in bathrooms and handrails by  stairs.  Placing non-slip mats on floors and in bathtubs.  Improve lighting in dark areas. GET HELP IF:  It gets even harder for you to pay attention or concentrate.  It gets even harder for you to remember things or learn new things.  You need more time than normal to finish things.  You become annoyed (irritable) more than before.  You are not able to deal with stress as well.  You have more problems than before.  You have problems keeping your balance.  You are not able to react quickly when you should. Get help if you have any of these problems for more than 2 weeks:   Lasting (chronic) headaches.  Dizziness or trouble balancing.  Feeling sick to your stomach (nausea).  Seeing (vision) problems.  Being affected by noises or light more than normal.  Feeling sad, low, down in the dumps, blue, gloomy, or empty (depressed).  Mood changes (mood swings).  Feeling of fear or nervousness about what may happen (anxiety).  Feeling annoyed.  Memory problems.  Problems concentrating or paying attention.  Sleep problems.  Feeling tired all the time. GET HELP RIGHT AWAY IF:   You have bad headaches or your headaches get worse.  You have weakness (even if it is in one hand, leg, or part of the face).  You have loss of feeling (numbness).  You feel off balance.  You keep throwing up (vomiting).  You feel tired.  One black center of your eye (pupil) is larger than the other.  You twitch or shake violently (convulse).  Your speech is not clear (slurred).  You are more confused, easily angered (agitated), or annoyed than before.  You have more trouble resting than before.  You are unable to recognize people or places.  You have neck pain.  It is difficult to wake you up.  You have unusual behavior changes.  You pass out (lose consciousness). MAKE SURE YOU:   Understand these instructions.  Will watch your condition.  Will get help right away if  you are not doing well or get worse.   This information is not intended to replace advice given to you by your health care provider. Make sure you discuss any questions you have with your health care provider.   Document Released: 08/13/2009 Document Revised: 09/15/2014 Document Reviewed: 03/17/2013 Elsevier Interactive Patient Education 2016 [place] Injury, Adult You have a head injury. Headaches and throwing up (vomiting) are common after a head injury. It should be easy to wake up from sleeping. Sometimes you must stay in the hospital. Most problems happen within the first 24 hours. Side effects may occur up to 7-10 days after the injury.  WHAT ARE THE TYPES OF HEAD INJURIES? Head injuries can be as minor as a bump. Some head injuries can be more severe. More severe head injuries include:  A jarring  injury to the brain (concussion).  A bruise of the brain (contusion). This mean there is bleeding in the brain that can cause swelling.  A cracked skull (skull fracture).  Bleeding in the brain that collects, clots, and forms a bump (hematoma). WHEN SHOULD I GET HELP RIGHT AWAY?   You are confused or sleepy.  You cannot be woken up.  You feel sick to your stomach (nauseous) or keep throwing up (vomiting).  Your dizziness or unsteadiness is getting worse.  You have very bad, lasting headaches that are not helped by medicine. Take medicines only as told by your doctor.  You cannot use your arms or legs like normal.  You cannot walk.  You notice changes in the black spots in the center of the colored part of your eye (pupil).  You have clear or bloody fluid coming from your nose or ears.  You have trouble seeing. During the next 24 hours after the injury, you must stay with someone who can watch you. This person should get help right away (call 911 in the [place] if you start to shake and are not able to control it (have seizures), you pass out, or you are unable to wake  up. HOW CAN I PREVENT A HEAD INJURY IN THE FUTURE?  Wear seat belts.  Wear a helmet while bike riding and playing sports like football.  Stay away from dangerous activities around the house. WHEN CAN I RETURN TO NORMAL ACTIVITIES AND ATHLETICS? See your doctor before doing these activities. You should not do normal activities or play contact sports until 1 week after the following symptoms have stopped:  Headache that does not go away.  Dizziness.  Poor attention.  Confusion.  Memory problems.  Sickness to your stomach or throwing up.  Tiredness.  Fussiness.  Bothered by bright lights or loud noises.  Anxiousness or depression.  Restless sleep. MAKE SURE YOU:   Understand these instructions.  Will watch your condition.  Will get help right away if you are not doing well or get worse.   This information is not intended to replace advice given to you by your health care provider. Make sure you discuss any questions you have with your health care provider.   Document Released: 08/07/2008 Document Revised: 09/15/2014 Document Reviewed: 05/02/2013 Elsevier Interactive Patient Education 2016 [employer].  Syncope Syncope means a person passes out (faints). The person usually wakes up in less than 5 minutes. It is important to seek medical care for syncope. HOME CARE  Have someone stay with you until you feel normal.  Do not drive, use machines, or play sports until your doctor says it is okay.  Keep all doctor visits as told.  Lie down when you feel like you might pass out. Take deep breaths. Wait until you feel normal before standing up.  Drink enough fluids to keep your pee (urine) clear or pale yellow.  If you take blood pressure or heart medicine, get up slowly. Take several minutes to sit and then stand. GET HELP RIGHT AWAY IF:   You have a severe headache.  You have pain in the chest, belly (abdomen), or back.  You are bleeding from the mouth or  butt (rectum).  You have black or tarry poop (stool).  You have an irregular or very fast heartbeat.  You have pain with breathing.  You keep passing out, or you have shaking (seizures) when you pass out.  You pass out when sitting or lying down.  You feel confused.  You have trouble walking.  You have severe weakness.  You have vision problems. If you fainted, call for help (911 in [place] Do not drive yourself to the hospital.   This information is not intended to replace advice given to you by your health care provider. Make sure you discuss any questions you have with your health care provider.   Document Released: 02/11/2008 Document Revised: 01/09/2015 Document Reviewed: 10/24/2011 Elsevier Interactive Patient Education 2016 [employer]  Cervical Strain and Sprain With Rehab Cervical strain and sprain are injuries that commonly occur with "whiplash" injuries. Whiplash occurs when the neck is forcefully whipped backward or forward, such as during a motor vehicle accident or during contact sports. The muscles, ligaments, tendons, discs, and nerves of the neck are susceptible to injury when this occurs. RISK FACTORS Risk of having a whiplash injury increases if:  Osteoarthritis of the spine.  Situations that make head or neck accidents or trauma more likely.  High-risk sports (football, rugby, wrestling, hockey, auto racing, gymnastics, diving, contact karate, or boxing).  Poor strength and flexibility of the neck.  Previous neck injury.  Poor tackling technique.  Improperly fitted or padded equipment. SYMPTOMS   Pain or stiffness in the front or back of neck or both.  Symptoms may present immediately or up to 24 hours after injury.  Dizziness, headache, nausea, and vomiting.  Muscle spasm with soreness and stiffness in the neck.  Tenderness and swelling at the injury site. PREVENTION  Learn and use proper technique (avoid tackling with the head,  spearing, and head-butting; use proper falling techniques to avoid landing on the head).  Warm up and stretch properly before activity.  Maintain physical fitness:  Strength, flexibility, and endurance.  Cardiovascular fitness.  Wear properly fitted and padded protective equipment, such as padded soft collars, for participation in contact sports. PROGNOSIS  Recovery from cervical strain and sprain injuries is dependent on the extent of the injury. These injuries are usually curable in 1 week to 3 months with appropriate treatment.  RELATED COMPLICATIONS   Temporary numbness and weakness may occur if the nerve roots are damaged, and this may persist until the nerve has completely healed.  Chronic pain due to frequent recurrence of symptoms.  Prolonged healing, especially if activity is resumed too soon (before complete recovery). TREATMENT  Treatment initially involves the use of ice and medication to help reduce pain and inflammation. It is also important to perform strengthening and stretching exercises and modify activities that worsen symptoms so the injury does not get worse. These exercises may be performed at home or with a therapist. For patients who experience severe symptoms, a soft, padded collar may be recommended to be worn around the neck.  Improving your posture may help reduce symptoms. Posture improvement includes pulling your chin and abdomen in while sitting or standing. If you are sitting, sit in a firm chair with your buttocks against the back of the chair. While sleeping, try replacing your pillow with a small towel rolled to 2 inches in diameter, or use a cervical pillow or soft cervical collar. Poor sleeping positions delay healing.  For patients with nerve root damage, which causes numbness or weakness, the use of a cervical traction apparatus may be recommended. Surgery is rarely necessary for these injuries. However, cervical strain and sprains that are present at  birth (congenital) may require surgery. MEDICATION   If pain medication is necessary, nonsteroidal anti-inflammatory medications, such as aspirin and ibuprofen, or  other minor pain relievers, such as acetaminophen, are often recommended.  Do not take pain medication for 7 days before surgery.  Prescription pain relievers may be given if deemed necessary by your caregiver. Use only as directed and only as much as you need. HEAT AND COLD:   Cold treatment (icing) relieves pain and reduces inflammation. Cold treatment should be applied for 10 to 15 minutes every 2 to 3 hours for inflammation and pain and immediately after any activity that aggravates your symptoms. Use ice packs or an ice massage.  Heat treatment may be used prior to performing the stretching and strengthening activities prescribed by your caregiver, physical therapist, or athletic trainer. Use a heat pack or a warm soak. SEEK MEDICAL CARE IF:   Symptoms get worse or do not improve in 2 weeks despite treatment.  New, unexplained symptoms develop (drugs used in treatment may produce side effects). EXERCISES RANGE OF MOTION (ROM) AND STRETCHING EXERCISES - Cervical Strain and Sprain These exercises may help you when beginning to rehabilitate your injury. In order to successfully resolve your symptoms, you must improve your posture. These exercises are designed to help reduce the forward-head and rounded-shoulder posture which contributes to this condition. Your symptoms may resolve with or without further involvement from your physician, physical therapist or athletic trainer. While completing these exercises, remember:   Restoring tissue flexibility helps normal motion to return to the joints. This allows healthier, less painful movement and activity.  An effective stretch should be held for at least 20 seconds, although you may need to begin with shorter hold times for comfort.  A stretch should never be painful. You should  only feel a gentle lengthening or release in the stretched tissue. STRETCH- Axial Extensors  Lie on your back on the floor. You may bend your knees for comfort. Place a rolled-up hand towel or dish towel, about 2 inches in diameter, under the part of your head that makes contact with the floor.  Gently tuck your chin, as if trying to make a "double chin," until you feel a gentle stretch at the base of your head.  Hold __________ seconds. Repeat __________ times. Complete this exercise __________ times per day.  STRETCH - Axial Extension   Stand or sit on a firm surface. Assume a good posture: chest up, shoulders drawn back, abdominal muscles slightly tense, knees unlocked (if standing) and feet hip width apart.  Slowly retract your chin so your head slides back and your chin slightly lowers. Continue to look straight ahead.  You should feel a gentle stretch in the back of your head. Be certain not to feel an aggressive stretch since this can cause headaches later.  Hold for __________ seconds. Repeat __________ times. Complete this exercise __________ times per day. STRETCH - Cervical Side Bend   Stand or sit on a firm surface. Assume a good posture: chest up, shoulders drawn back, abdominal muscles slightly tense, knees unlocked (if standing) and feet hip width apart.  Without letting your nose or shoulders move, slowly tip your right / left ear to your shoulder until your feel a gentle stretch in the muscles on the opposite side of your neck.  Hold __________ seconds. Repeat __________ times. Complete this exercise __________ times per day. STRETCH - Cervical Rotators   Stand or sit on a firm surface. Assume a good posture: chest up, shoulders drawn back, abdominal muscles slightly tense, knees unlocked (if standing) and feet hip width apart.  Keeping your eyes level  with the ground, slowly turn your head until you feel a gentle stretch along the back and opposite side of your  neck.  Hold __________ seconds. Repeat __________ times. Complete this exercise __________ times per day. RANGE OF MOTION - Neck Circles   Stand or sit on a firm surface. Assume a good posture: chest up, shoulders drawn back, abdominal muscles slightly tense, knees unlocked (if standing) and feet hip width apart.  Gently roll your head down and around from the back of one shoulder to the back of the other. The motion should never be forced or painful.  Repeat the motion 10-20 times, or until you feel the neck muscles relax and loosen. Repeat __________ times. Complete the exercise __________ times per day. STRENGTHENING EXERCISES - Cervical Strain and Sprain These exercises may help you when beginning to rehabilitate your injury. They may resolve your symptoms with or without further involvement from your physician, physical therapist, or athletic trainer. While completing these exercises, remember:   Muscles can gain both the endurance and the strength needed for everyday activities through controlled exercises.  Complete these exercises as instructed by your physician, physical therapist, or athletic trainer. Progress the resistance and repetitions only as guided.  You may experience muscle soreness or fatigue, but the pain or discomfort you are trying to eliminate should never worsen during these exercises. If this pain does worsen, stop and make certain you are following the directions exactly. If the pain is still present after adjustments, discontinue the exercise until you can discuss the trouble with your clinician. STRENGTH - Cervical Flexors, Isometric  Face a wall, standing about 6 inches away. Place a small pillow, a ball about 6-8 inches in diameter, or a folded towel between your forehead and the wall.  Slightly tuck your chin and gently push your forehead into the soft object. Push only with mild to moderate intensity, building up tension gradually. Keep your jaw and forehead  relaxed.  Hold 10 to 20 seconds. Keep your breathing relaxed.  Release the tension slowly. Relax your neck muscles completely before you start the next repetition. Repeat __________ times. Complete this exercise __________ times per day. STRENGTH- Cervical Lateral Flexors, Isometric   Stand about 6 inches away from a wall. Place a small pillow, a ball about 6-8 inches in diameter, or a folded towel between the side of your head and the wall.  Slightly tuck your chin and gently tilt your head into the soft object. Push only with mild to moderate intensity, building up tension gradually. Keep your jaw and forehead relaxed.  Hold 10 to 20 seconds. Keep your breathing relaxed.  Release the tension slowly. Relax your neck muscles completely before you start the next repetition. Repeat __________ times. Complete this exercise __________ times per day. STRENGTH - Cervical Extensors, Isometric   Stand about 6 inches away from a wall. Place a small pillow, a ball about 6-8 inches in diameter, or a folded towel between the back of your head and the wall.  Slightly tuck your chin and gently tilt your head back into the soft object. Push only with mild to moderate intensity, building up tension gradually. Keep your jaw and forehead relaxed.  Hold 10 to 20 seconds. Keep your breathing relaxed.  Release the tension slowly. Relax your neck muscles completely before you start the next repetition. Repeat __________ times. Complete this exercise __________ times per day. POSTURE AND BODY MECHANICS CONSIDERATIONS - Cervical Strain and Sprain Keeping correct posture when sitting, standing  or completing your activities will reduce the stress put on different body tissues, allowing injured tissues a chance to heal and limiting painful experiences. The following are general guidelines for improved posture. Your physician or physical therapist will provide you with any instructions specific to your needs. While  reading these guidelines, remember:  The exercises prescribed by your provider will help you have the flexibility and strength to maintain correct postures.  The correct posture provides the optimal environment for your joints to work. All of your joints have less wear and tear when properly supported by a spine with good posture. This means you will experience a healthier, less painful body.  Correct posture must be practiced with all of your activities, especially prolonged sitting and standing. Correct posture is as important when doing repetitive low-stress activities (typing) as it is when doing a single heavy-load activity (lifting). PROLONGED STANDING WHILE SLIGHTLY LEANING FORWARD When completing a task that requires you to lean forward while standing in one place for a long time, place either foot up on a stationary 2- to 4-inch high object to help maintain the best posture. When both feet are on the ground, the low back tends to lose its slight inward curve. If this curve flattens (or becomes too large), then the back and your other joints will experience too much stress, fatigue more quickly, and can cause pain.  RESTING POSITIONS Consider which positions are most painful for you when choosing a resting position. If you have pain with flexion-based activities (sitting, bending, stooping, squatting), choose a position that allows you to rest in a less flexed posture. You would want to avoid curling into a fetal position on your side. If your pain worsens with extension-based activities (prolonged standing, working overhead), avoid resting in an extended position such as sleeping on your stomach. Most people will find more comfort when they rest with their spine in a more neutral position, neither too rounded nor too arched. Lying on a non-sagging bed on your side with a pillow between your knees, or on your back with a pillow under your knees will often provide some relief. Keep in mind, being in  any one position for a prolonged period of time, no matter how correct your posture, can still lead to stiffness. WALKING Walk with an upright posture. Your ears, shoulders, and hips should all line up. OFFICE WORK When working at a desk, create an environment that supports good, upright posture. Without extra support, muscles fatigue and lead to excessive strain on joints and other tissues. CHAIR:  A chair should be able to slide under your desk when your back makes contact with the back of the chair. This allows you to work closely.  The chair's height should allow your eyes to be level with the upper part of your monitor and your hands to be slightly lower than your elbows.  Body position:  Your feet should make contact with the floor. If this is not possible, use a foot rest.  Keep your ears over your shoulders. This will reduce stress on your neck and low back.   This information is not intended to replace advice given to you by your health care provider. Make sure you discuss any questions you have with your health care provider.   Document Released: 08/25/2005 Document Revised: 09/15/2014 Document Reviewed: 12/07/2008 Elsevier Interactive Patient Education [employer].

## 2015-11-06 LAB — URINE CULTURE: CULTURE: NO GROWTH

## 2015-11-29 IMAGING — CT CT ABD-PELV W/ CM
3 of 5 series · 13 of 36 positions shown, 19 images · IV contrast (READICAT/WATER & [ID] OMNI 300)
Comparison: 12/13/2009

CLINICAL DATA: History of carcinoid of the appendix, abdominal pain

EXAM:
CT ABDOMEN AND PELVIS WITH CONTRAST
TECHNIQUE: Multidetector CT imaging of the abdomen and pelvis was performed
using the standard protocol following bolus administration of
intravenous contrast.
CONTRAST:  100mL OMNIPAQUE IOHEXOL 300 MG/ML  SOLN

[Series 3: abd/pelvis with · axial · 0.76mm/px · z∈[-320,-15]mm · 7 of 83 slices shown, 12 images]
[im 11/83  soft-tissue]
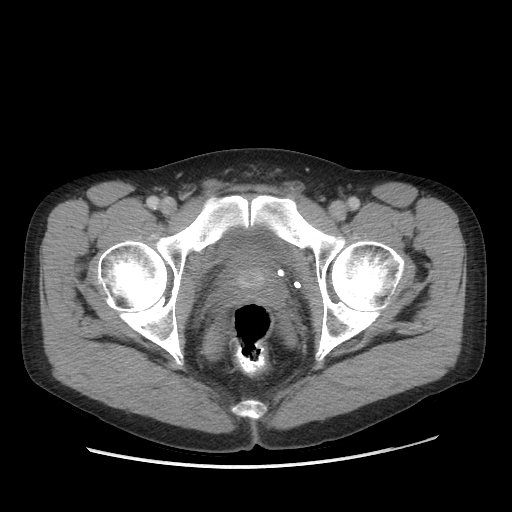
[im 11/83  bone]
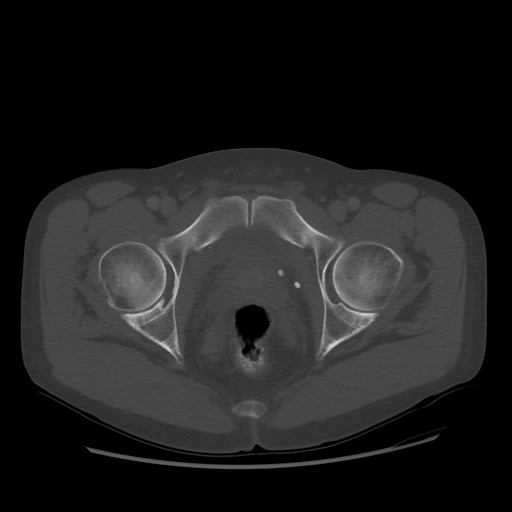
[im 21/83  soft-tissue]
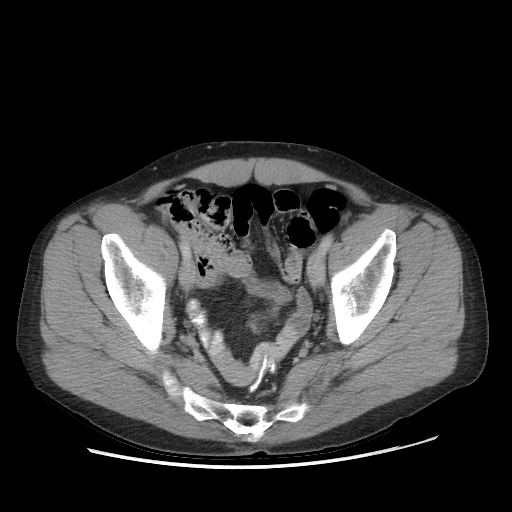
[im 31/83  soft-tissue]
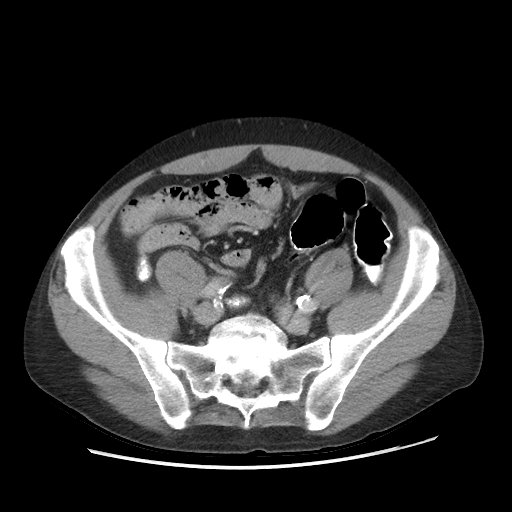
[im 42/83  soft-tissue]
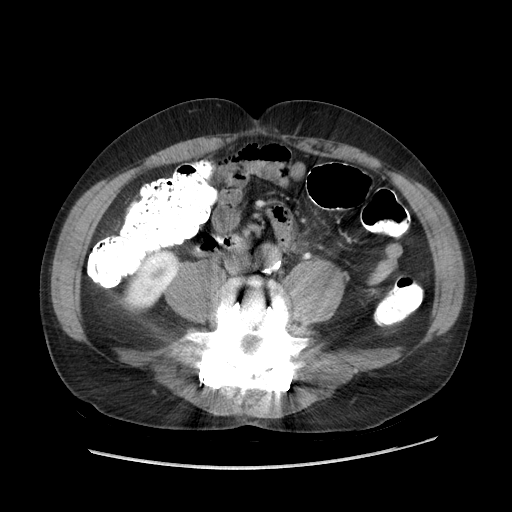
[im 42/83  lung]
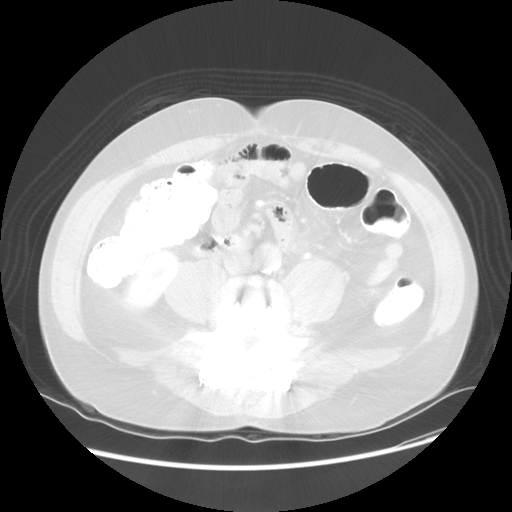
[im 52/83  soft-tissue]
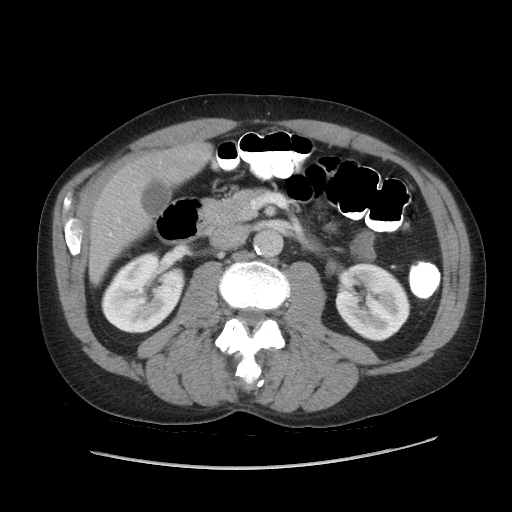
[im 52/83  lung]
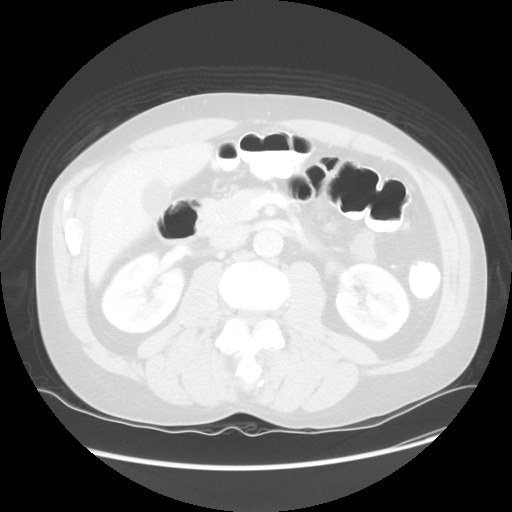
[im 62/83  soft-tissue]
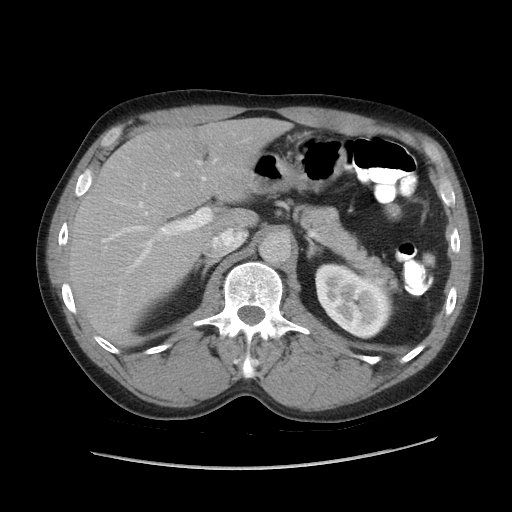
[im 62/83  lung]
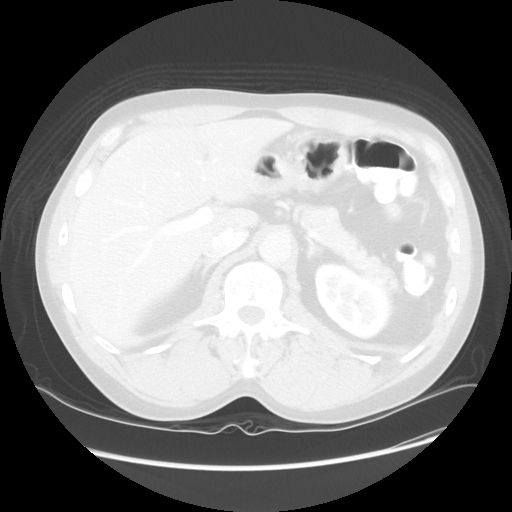
[im 72/83  soft-tissue]
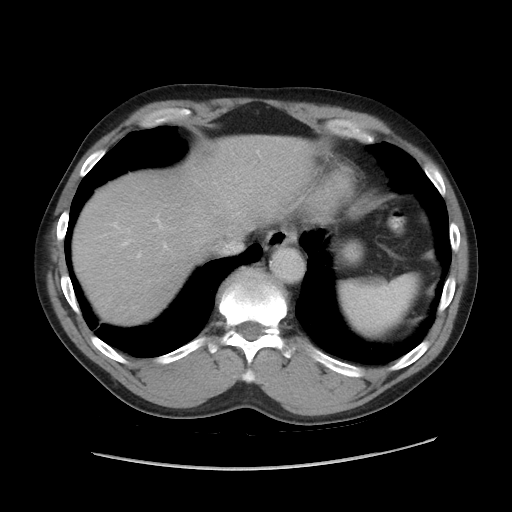
[im 72/83  lung]
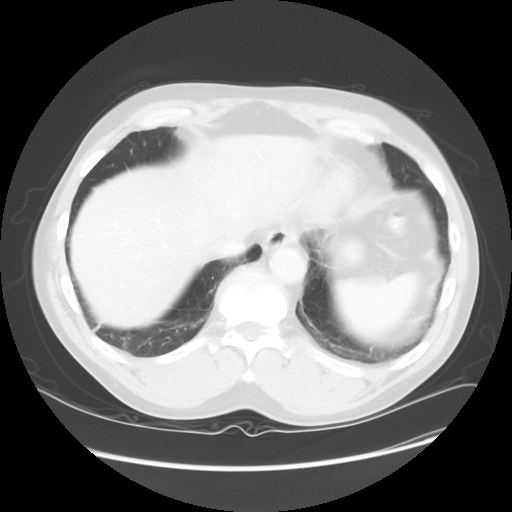

[Series 601: coronal body · coronal · 0.91mm/px · 1 of 116 slices shown, 2 images]
[im 39/116  soft-tissue]
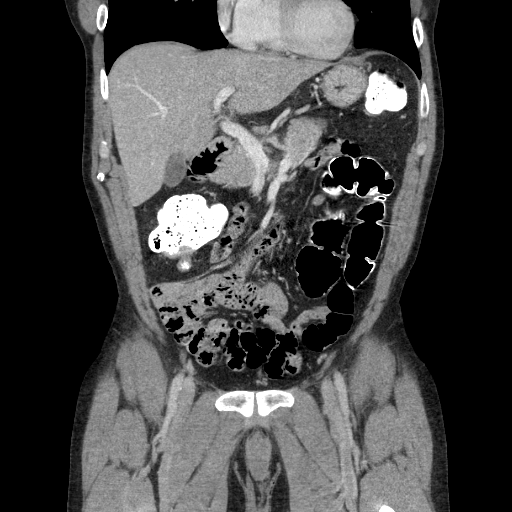
[im 39/116  bone]
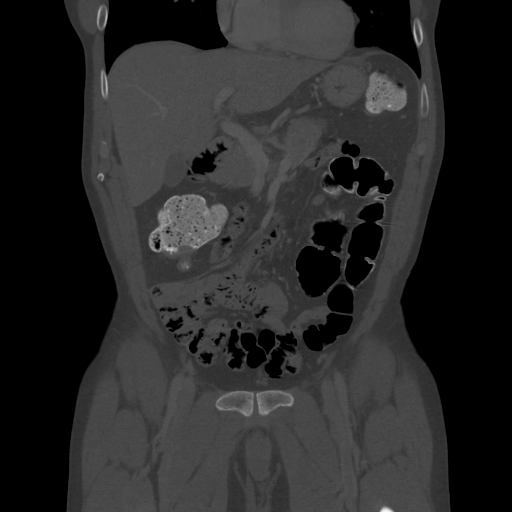

[Series 602: sagittal body · sagittal · 0.91mm/px · 5 of 149 slices shown]
[im 10/149  soft-tissue]
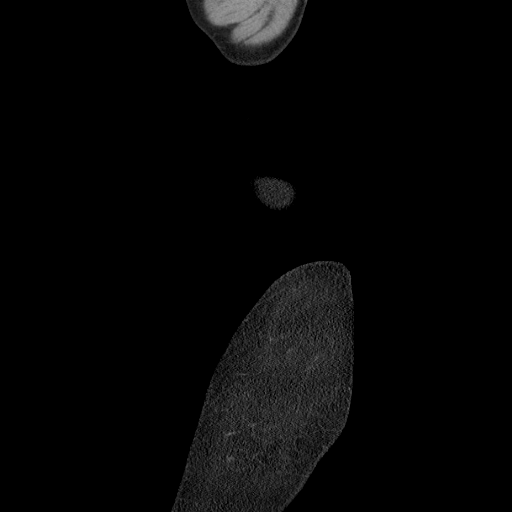
[im 28/149  soft-tissue]
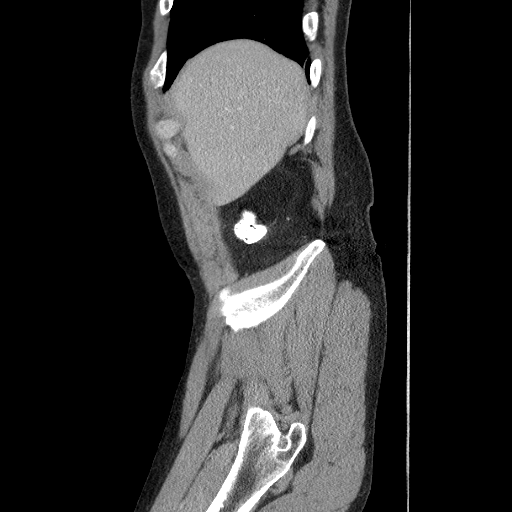
[im 47/149  soft-tissue]
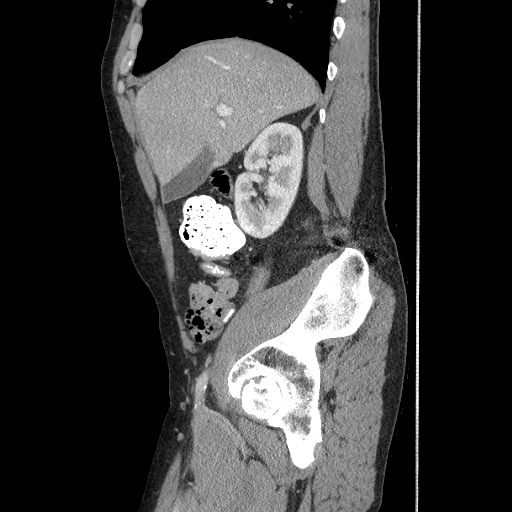
[im 65/149  soft-tissue]
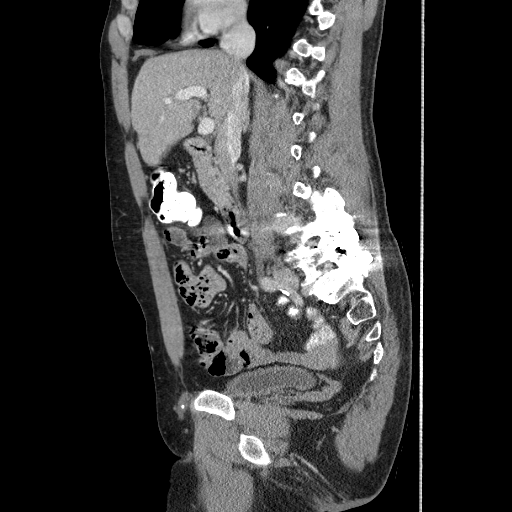
[im 84/149  soft-tissue]
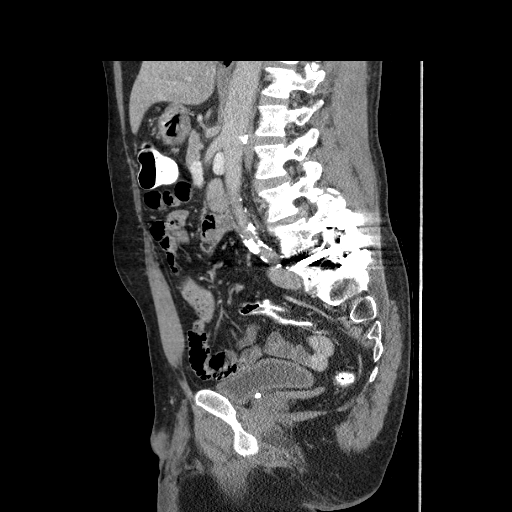

[13 of 36 positions shown; findings below may reference images not displayed]

FINDINGS: The lung bases are free of acute infiltrate or sizable effusion.
Mild emphysematous changes are noted.

The liver, gallbladder, spleen, adrenal glands and pancreas are
within normal limits. The kidney show a normal enhancement pattern.
No renal calculi or obstructive changes are seen. Delayed images
show normal excretion of contrast.

Postsurgical changes are again noted in the right lower quadrant.
Postsurgical and degenerative changes are noted in the lower lumbar
spine. No acute bony abnormality is seen the bladder is well
distended with non-opacified urine. No pelvic mass lesion or
sidewall abnormality is noted.
IMPRESSION: Stable postoperative changes. No acute abnormality is seen. The
overall appearance is similar to that seen on the prior exam.

## 2016-03-06 ENCOUNTER — Emergency Department (HOSPITAL_COMMUNITY)
Admission: EM | Admit: 2016-03-06 | Discharge: 2016-03-06 | Disposition: A | Discharge status: Home / Self Care | Payer: Medicaid Other | Attending: Emergency Medicine | Admitting: Emergency Medicine

## 2016-03-06 ENCOUNTER — Emergency Department (HOSPITAL_COMMUNITY): Payer: Medicaid Other

## 2016-03-06 ENCOUNTER — Encounter (HOSPITAL_COMMUNITY): Payer: Self-pay | Admitting: Emergency Medicine

## 2016-03-06 DIAGNOSIS — Y939 Activity, unspecified: Secondary | ICD-10-CM | POA: Insufficient documentation

## 2016-03-06 DIAGNOSIS — Y990 Civilian activity done for income or pay: Secondary | ICD-10-CM | POA: Diagnosis not present

## 2016-03-06 DIAGNOSIS — I10 Essential (primary) hypertension: Secondary | ICD-10-CM | POA: Diagnosis not present

## 2016-03-06 DIAGNOSIS — F17210 Nicotine dependence, cigarettes, uncomplicated: Secondary | ICD-10-CM | POA: Diagnosis not present

## 2016-03-06 DIAGNOSIS — Y929 Unspecified place or not applicable: Secondary | ICD-10-CM | POA: Diagnosis not present

## 2016-03-06 DIAGNOSIS — Z79899 Other long term (current) drug therapy: Secondary | ICD-10-CM | POA: Diagnosis not present

## 2016-03-06 DIAGNOSIS — R55 Syncope and collapse: Secondary | ICD-10-CM

## 2016-03-06 DIAGNOSIS — W19XXXA Unspecified fall, initial encounter: Secondary | ICD-10-CM | POA: Insufficient documentation

## 2016-03-06 DIAGNOSIS — M542 Cervicalgia: Secondary | ICD-10-CM | POA: Insufficient documentation

## 2016-03-06 LAB — COMPREHENSIVE METABOLIC PANEL
ALBUMIN: 4.2 g/dL (ref 3.5–5.0)
ALK PHOS: 52 U/L (ref 38–126)
ALT: 28 U/L (ref 17–63)
AST: 36 U/L (ref 15–41)
Anion gap: 5 (ref 5–15)
BILIRUBIN TOTAL: 0.6 mg/dL (ref 0.3–1.2)
BUN: 9 mg/dL (ref 6–20)
CALCIUM: 9 mg/dL (ref 8.9–10.3)
CO2: 25 mmol/L (ref 22–32)
CREATININE: 0.72 mg/dL (ref 0.61–1.24)
Chloride: 110 mmol/L (ref 101–111)
GFR calc Af Amer: >60 mL/min (ref >60)
GLUCOSE: 67 mg/dL (ref 65–99)
Potassium: 4.7 mmol/L (ref 3.5–5.1)
Sodium: 140 mmol/L (ref 135–145)
TOTAL PROTEIN: 7.1 g/dL (ref 6.5–8.1)

## 2016-03-06 LAB — I-STAT TROPONIN, ED
TROPONIN I, POC: 0 ng/mL (ref 0.00–0.08)
Troponin i, poc: 0 ng/mL (ref 0.00–0.08)

## 2016-03-06 LAB — CBC WITH DIFFERENTIAL/PLATELET
BASOS ABS: 0 10*3/uL (ref 0.0–0.1)
BASOS PCT: 1 %
Eosinophils Absolute: 0.3 10*3/uL (ref 0.0–0.7)
Eosinophils Relative: 5 %
HEMATOCRIT: 39.5 % (ref 39.0–52.0)
HEMOGLOBIN: 13.5 g/dL (ref 13.0–17.0)
LYMPHS PCT: 48 %
Lymphs Abs: 2.4 10*3/uL (ref 0.7–4.0)
MCH: 32.8 pg (ref 26.0–34.0)
MCHC: 34.2 g/dL (ref 30.0–36.0)
MCV: 95.9 fL (ref 78.0–100.0)
MONO ABS: 0.4 10*3/uL (ref 0.1–1.0)
Monocytes Relative: 7 %
NEUTROS ABS: 2 10*3/uL (ref 1.7–7.7)
NEUTROS PCT: 39 %
Platelets: 227 10*3/uL (ref 150–400)
RBC: 4.12 MIL/uL — ABNORMAL LOW (ref 4.22–5.81)
RDW: 13.4 % (ref 11.5–15.5)
WBC: 5 10*3/uL (ref 4.0–10.5)

## 2016-03-06 LAB — D-DIMER, QUANTITATIVE: D-Dimer, Quant: 0.45 ug/mL-FEU (ref 0.00–0.50)

## 2016-03-06 MED ORDER — OXYCODONE HCL 5 MG PO TABS
5.0000 mg | ORAL_TABLET | Freq: Once | ORAL | Status: AC
  Administered 2016-03-06: 5 mg via ORAL
  Filled 2016-03-06: qty 1

## 2016-03-06 MED ORDER — KETOROLAC TROMETHAMINE 60 MG/2ML IM SOLN
30.0000 mg | Freq: Once | INTRAMUSCULAR | Status: DC
  Filled 2016-03-06: qty 2

## 2016-03-06 MED ORDER — OXYCODONE-ACETAMINOPHEN 5-325 MG PO TABS
1.0000 | ORAL_TABLET | Freq: Once | ORAL | Status: AC
  Administered 2016-03-06: 1 via ORAL
  Filled 2016-03-06: qty 1

## 2016-03-06 NOTE — ED Notes (Signed)
Patient ambulated with no complaints of dizziness or weakness.

## 2016-03-06 NOTE — ED Notes (Signed)
Bed: [id number] Expected date:  Expected time:  Means of arrival:  Comments: EMS-fall/syncope

## 2016-03-06 NOTE — ED Notes (Signed)
Per EMS patient was in parking lot when got weak and had syncopal episode with LOC of around 1-2 minutes per friends causing fall to ground.  Pt was A&o x 4 when EMS arrived and reported to having similar episode couple months ago and MD took off one medication which patient unable to recall and placed on beta blocker because MD thought he was having an allergic reaction to medication.  Patient BP 70/40, 18g in left bicep was placed and patient received 1L NS in route.  Patient c/o right lateral neck and side pain from fall but refused c-collar.

## 2016-03-06 NOTE — ED Notes (Signed)
Patient transported to CT 

## 2016-03-06 NOTE — ED Provider Notes (Signed)
CSN: [id number]     Arrival date & time 03/06/16  1110 History   First MD Initiated Contact with Patient 03/06/16 1131     Chief Complaint  Patient presents with  . Loss of Consciousness     (Consider location/radiation/quality/duration/timing/severity/associated sxs/prior Treatment) HPI Todd SEPTER is a 60 y.o. male with history of chronic back pain and hypertension, presents to emergency department after a syncopal episode. Patient states he was outside at work, states he really wasn't doing anything but standing and talking to someone when he suddenly started feeling lightheaded, dizzy, and started to feel hot. He denies any associated chest pain or palpitations at that time. He states he walked to the side of the building and then he remembers waking up on the floor. Patient states he missed and hit his head, he is complaining of slight headache and abrasion to the right face. He is unsure how long he was unconscious. He states now laying down he feels well.  Past Medical History  Diagnosis Date  . Chronic back pain     S/P SPINAL FUSION - SOME INTERMITTEN NUMBNESS RT LEG AND CHRONIC PAIN AND STIFFNESS LUMBAR  . Hypertension    Past Surgical History  Procedure Laterality Date  . Back surgery      X 2 INCLUDING FUSION OF SPINE  . Colon surgery  2011    PARTIAL COLON RESECTION FOR CANCER  . Multiple extractions with alveoloplasty N/A 07/22/2013    Procedure: EXTRACTIONS WITH REMOVAL OF TORI AND REMOVAL OF EXOSTOSIS;  Surgeon: [name], DDS;  Location: [facility];  Service: Oral Surgery;  Laterality: N/A;  . Wisdom tooth extraction    . Tonsillectomy and adenoidectomy Left 05/02/2014    Procedure: Incision and and drainage of left oral cavity abscess;  Surgeon: [name], MD;  Location: [place];  Service: ENT;  Laterality: Left;   No family history on file. Social History  Substance Use Topics  . Smoking status: Current Every Day Smoker -- 0.25 packs/day for 30 years   Types: Cigarettes  . Smokeless tobacco: Never Used  . Alcohol Use: Yes     Comment: occasional    Review of Systems  Constitutional: Negative for fever and chills.  Respiratory: Negative for cough, chest tightness and shortness of breath.   Cardiovascular: Negative for chest pain, palpitations and leg swelling.  Gastrointestinal: Negative for nausea, vomiting, abdominal pain, diarrhea and abdominal distention.  Genitourinary: Negative for dysuria, urgency, frequency and hematuria.  Musculoskeletal: Positive for arthralgias and neck pain. Negative for myalgias and neck stiffness.  Skin: Negative for rash.  Allergic/Immunologic: Negative for immunocompromised state.  Neurological: Positive for dizziness, syncope and light-headedness. Negative for weakness, numbness and headaches.  All other systems reviewed and are negative.     Allergies  Vicodin  Home Medications   Prior to Admission medications   Medication Sig Start Date End Date Taking? Authorizing Provider  amLODipine (NORVASC) 5 MG tablet Take 5 mg by mouth daily. 10/17/15  Yes Historical Provider, MD  carvedilol (COREG) 3.125 MG tablet Take 3.125 mg by mouth 2 (two) times daily with a meal.   Yes Historical Provider, MD  gemfibrozil (LOPID) 600 MG tablet Take 600 mg by mouth 2 (two) times daily. 10/10/15  Yes Historical Provider, MD  naproxen (NAPROSYN) 500 MG tablet Take 500 mg by mouth 2 (two) times daily as needed for moderate pain.  09/24/15  Yes Historical Provider, MD  ondansetron (ZOFRAN-ODT) 4 MG disintegrating tablet Take 4 mg  by mouth every 6 (six) hours as needed. nausea 08/08/15  Yes Historical Provider, MD  oxyCODONE (ROXICODONE) 15 MG immediate release tablet Take 7.5-15 mg by mouth 3 (three) times daily as needed for pain. pain 10/25/15  Yes Historical Provider, MD  predniSONE (DELTASONE) 10 MG tablet Take 10-40 mg by mouth as directed. Reported on 03/06/2016 01/29/16   Historical Provider, MD   BP 114/87 mmHg  Pulse  58  Temp(Src) 97.6 F (36.4 C) (Oral)  Resp 16  SpO2 100% Physical Exam  Constitutional: He is oriented to person, place, and time. He appears well-developed and well-nourished. No distress.  HENT:  Head: Normocephalic and atraumatic.  Eyes: Conjunctivae and EOM are normal. Pupils are equal, round, and reactive to light.  Neck: Normal range of motion. Neck supple.  Midline cervical spine tenderness.  Cardiovascular: Normal rate, regular rhythm and normal heart sounds.   Pulmonary/Chest: Effort normal. No respiratory distress. He has no wheezes. He has no rales.  Abdominal: Soft. Bowel sounds are normal. He exhibits no distension. There is no tenderness. There is no rebound.  Musculoskeletal: He exhibits no edema.  Neurological: He is alert and oriented to person, place, and time. No cranial nerve deficit. Coordination normal.  5/5 and equal upper and lower extremity strength bilaterally. Equal grip strength bilaterally. Normal finger to nose and heel to shin. No pronator drift. Gait normal  Skin: Skin is warm and dry.  Nursing note and vitals reviewed.   ED Course  Procedures (including critical care time) Labs Review Labs Reviewed  CBC WITH DIFFERENTIAL/PLATELET - Abnormal; Notable for the following:    RBC 4.12 (*)    All other components within normal limits  COMPREHENSIVE METABOLIC PANEL  D-DIMER, QUANTITATIVE (NOT AT [facility])  I-STAT TROPOININ, ED  I-STAT TROPOININ, ED  I-STAT TROPOININ, ED    Imaging Review Dg Chest 2 View  03/06/2016  CLINICAL DATA:  Became weak then passed out in a parking lot, fell to ground, loss of consciousness for 1-2 minutes, history hypertension and smoking EXAM: CHEST  2 VIEW COMPARISON:  07/21/2013 FINDINGS: Normal heart size, mediastinal contours, and pulmonary vascularity. Atherosclerotic calcification aorta. Lungs emphysematous but clear. No infiltrate, pleural effusion or pneumothorax. No acute osseous findings. Mild BILATERAL glenohumeral  degenerative changes. IMPRESSION: COPD changes. Aortic atherosclerosis. No acute abnormalities. Electronically Signed   By: Lavonia Dana M.D.   On: 03/06/2016 13:29   Ct Head Wo Contrast  03/06/2016  CLINICAL DATA:  Recent syncopal episode with headaches and neck pain EXAM: CT HEAD WITHOUT CONTRAST CT CERVICAL SPINE WITHOUT CONTRAST TECHNIQUE: Multidetector CT imaging of the head and cervical spine was performed following the standard protocol without intravenous contrast. Multiplanar CT image reconstructions of the cervical spine were also generated. COMPARISON:  11/05/2015 FINDINGS: CT HEAD FINDINGS Bony calvarium is intact. Diffuse atrophic changes are again identified and stable. No findings to suggest acute hemorrhage, acute infarction or space-occupying mass lesion are noted. CT CERVICAL SPINE FINDINGS Seven cervical segments are well visualized. Vertebral body height is well maintained. Disc space narrowing and osteophytic changes are noted at C5-6 and C6-7. Facet hypertrophic changes are noted at these levels as well. No findings to suggest acute fracture or acute facet abnormality are noted. The surrounding soft tissue structures are within normal limits. Mild emphysematous changes are noted within the lung apices. IMPRESSION: CT of the head: Chronic atrophic changes. No acute abnormality noted. CT of cervical spine: Degenerative change without acute abnormality. Electronically Signed   By: Inez Catalina  M.D.   On: 03/06/2016 12:31   Ct Cervical Spine Wo Contrast  03/06/2016  CLINICAL DATA:  Recent syncopal episode with headaches and neck pain EXAM: CT HEAD WITHOUT CONTRAST CT CERVICAL SPINE WITHOUT CONTRAST TECHNIQUE: Multidetector CT imaging of the head and cervical spine was performed following the standard protocol without intravenous contrast. Multiplanar CT image reconstructions of the cervical spine were also generated. COMPARISON:  11/05/2015 FINDINGS: CT HEAD FINDINGS Bony calvarium is intact.  Diffuse atrophic changes are again identified and stable. No findings to suggest acute hemorrhage, acute infarction or space-occupying mass lesion are noted. CT CERVICAL SPINE FINDINGS Seven cervical segments are well visualized. Vertebral body height is well maintained. Disc space narrowing and osteophytic changes are noted at C5-6 and C6-7. Facet hypertrophic changes are noted at these levels as well. No findings to suggest acute fracture or acute facet abnormality are noted. The surrounding soft tissue structures are within normal limits. Mild emphysematous changes are noted within the lung apices. IMPRESSION: CT of the head: Chronic atrophic changes. No acute abnormality noted. CT of cervical spine: Degenerative change without acute abnormality. Electronically Signed   By: Inez Catalina M.D.   On: 03/06/2016 12:31   I have personally reviewed and evaluated these images and lab results as part of my medical decision-making.   EKG Interpretation None      MDM   Final diagnoses:  Syncope, unspecified syncope type   Patient in emergency department after a syncopal episode. Denies any chest pain or palpitations. Denies headache. Currently asymptomatic other than the pain in his neck from the fall. Patient has history of chronic neck and back pain.  Labs unremarkable. Discussed with Dr. Ralene Bathe. She has seen patient as well, had a d-dimer. Plan is to recheck delta troponin. Patient is low risk, based on [place] syncope score. We will continue monitoring and administer IV fluids.  D-dimer is negative. Delta troponin is 0. This time patient is stable for discharge. Patient ambulated with no difficulty. He will be discharged home with close outpatient follow-up. Return precautions discussed.  Filed Vitals:   03/06/16 1242 03/06/16 1427 03/06/16 1707 03/06/16 1711  BP: 119/87 110/78  133/87  Pulse: 61 57 54   Temp:      TempSrc:      Resp: 14 19 17    SpO2: 100% 98% 100%     I personally  performed the services described in this documentation, which was scribed in my presence. The recorded information has been reviewed and is accurate.   Jeannett Senior, PA-C 03/06/16 1934  Quintella Reichert, MD 03/08/16 1213

## 2016-03-06 NOTE — ED Notes (Signed)
Pt was sitting on the side of the stretcher, states he is ready to go, he has been waiting for a long time.  [place] Rocker EDPA made aware

## 2016-03-06 NOTE — Discharge Instructions (Signed)
Make sure you drink plenty of fluids. Your blood work came back normal today. Please follow-up with your primary care doctor for recheck of further evaluation. Please return if worsening.   Syncope Syncope is a medical term for fainting or passing out. This means you lose consciousness and drop to the ground. People are generally unconscious for less than 5 minutes. You may have some muscle twitches for up to 15 seconds before waking up and returning to normal. Syncope occurs more often in older adults, but it can happen to anyone. While most causes of syncope are not dangerous, syncope can be a sign of a serious medical problem. It is important to seek medical care.  CAUSES  Syncope is caused by a sudden drop in blood flow to the brain. The specific cause is often not determined. Factors that can bring on syncope include:  Taking medicines that lower blood pressure.  Sudden changes in posture, such as standing up quickly.  Taking more medicine than prescribed.  Standing in one place for too long.  Seizure disorders.  Dehydration and excessive exposure to heat.  Low blood sugar (hypoglycemia).  Straining to have a bowel movement.  Heart disease, irregular heartbeat, or other circulatory problems.  Fear, emotional distress, seeing blood, or severe pain. SYMPTOMS  Right before fainting, you may:  Feel dizzy or light-headed.  Feel nauseous.  See all white or all black in your field of vision.  Have cold, clammy skin. DIAGNOSIS  Your health care provider will ask about your symptoms, perform a physical exam, and perform an electrocardiogram (ECG) to record the electrical activity of your heart. Your health care provider may also perform other heart or blood tests to determine the cause of your syncope which may include:  Transthoracic echocardiogram (TTE). During echocardiography, sound waves are used to evaluate how blood flows through your heart.  Transesophageal  echocardiogram (TEE).  Cardiac monitoring. This allows your health care provider to monitor your heart rate and rhythm in real time.  Holter monitor. This is a portable device that records your heartbeat and can help diagnose heart arrhythmias. It allows your health care provider to track your heart activity for several days, if needed.  Stress tests by exercise or by giving medicine that makes the heart beat faster. TREATMENT  In most cases, no treatment is needed. Depending on the cause of your syncope, your health care provider may recommend changing or stopping some of your medicines. HOME CARE INSTRUCTIONS  Have someone stay with you until you feel stable.  Do not drive, use machinery, or play sports until your health care provider says it is okay.  Keep all follow-up appointments as directed by your health care provider.  Lie down right away if you start feeling like you might faint. Breathe deeply and steadily. Wait until all the symptoms have passed.  Drink enough fluids to keep your urine clear or pale yellow.  If you are taking blood pressure or heart medicine, get up slowly and take several minutes to sit and then stand. This can reduce dizziness. SEEK IMMEDIATE MEDICAL CARE IF:   You have a severe headache.  You have unusual pain in the chest, abdomen, or back.  You are bleeding from your mouth or rectum, or you have black or tarry stool.  You have an irregular or very fast heartbeat.  You have pain with breathing.  You have repeated fainting or seizure-like jerking during an episode.  You faint when sitting or lying down.  You have confusion.  You have trouble walking.  You have severe weakness.  You have vision problems. If you fainted, call your local emergency services (911 in [place] Do not drive yourself to the hospital.    This information is not intended to replace advice given to you by your health care provider. Make sure you discuss any questions  you have with your health care provider.   Document Released: 08/25/2005 Document Revised: 01/09/2015 Document Reviewed: 10/24/2011 Elsevier Interactive Patient Education [employer].

## 2016-03-06 NOTE — ED Notes (Signed)
Patient's abrasions over right eye and right elbow were cleaned with saline and 4x4s, bacitracin applied and bandage applied.

## 2016-03-06 NOTE — ED Notes (Signed)
Pt's wife at bedside, pt is laying in bed.  Waiting for delta troponin's result.

## 2016-05-13 ENCOUNTER — Emergency Department (HOSPITAL_COMMUNITY): Payer: Medicaid Other

## 2016-05-13 ENCOUNTER — Emergency Department (HOSPITAL_COMMUNITY)
Admission: EM | Admit: 2016-05-13 | Discharge: 2016-05-13 | Disposition: A | Discharge status: Home / Self Care | Payer: Medicaid Other | Attending: Emergency Medicine | Admitting: Emergency Medicine

## 2016-05-13 ENCOUNTER — Encounter (HOSPITAL_COMMUNITY): Payer: Self-pay

## 2016-05-13 DIAGNOSIS — I10 Essential (primary) hypertension: Secondary | ICD-10-CM | POA: Insufficient documentation

## 2016-05-13 DIAGNOSIS — S8254XA Nondisplaced fracture of medial malleolus of right tibia, initial encounter for closed fracture: Secondary | ICD-10-CM | POA: Diagnosis not present

## 2016-05-13 DIAGNOSIS — Z79899 Other long term (current) drug therapy: Secondary | ICD-10-CM | POA: Diagnosis not present

## 2016-05-13 DIAGNOSIS — S99911A Unspecified injury of right ankle, initial encounter: Secondary | ICD-10-CM | POA: Diagnosis present

## 2016-05-13 DIAGNOSIS — Y990 Civilian activity done for income or pay: Secondary | ICD-10-CM | POA: Insufficient documentation

## 2016-05-13 DIAGNOSIS — S82891A Other fracture of right lower leg, initial encounter for closed fracture: Secondary | ICD-10-CM

## 2016-05-13 DIAGNOSIS — R55 Syncope and collapse: Secondary | ICD-10-CM | POA: Diagnosis not present

## 2016-05-13 DIAGNOSIS — W07XXXA Fall from chair, initial encounter: Secondary | ICD-10-CM | POA: Insufficient documentation

## 2016-05-13 DIAGNOSIS — Y929 Unspecified place or not applicable: Secondary | ICD-10-CM | POA: Insufficient documentation

## 2016-05-13 DIAGNOSIS — F17210 Nicotine dependence, cigarettes, uncomplicated: Secondary | ICD-10-CM | POA: Diagnosis not present

## 2016-05-13 DIAGNOSIS — Y939 Activity, unspecified: Secondary | ICD-10-CM | POA: Insufficient documentation

## 2016-05-13 LAB — CBC
HEMATOCRIT: 43.8 % (ref 39.0–52.0)
Hemoglobin: 14.7 g/dL (ref 13.0–17.0)
MCH: 32.7 pg (ref 26.0–34.0)
MCHC: 33.6 g/dL (ref 30.0–36.0)
MCV: 97.6 fL (ref 78.0–100.0)
Platelets: 218 10*3/uL (ref 150–400)
RBC: 4.49 MIL/uL (ref 4.22–5.81)
RDW: 12.7 % (ref 11.5–15.5)
WBC: 3.8 10*3/uL — AB (ref 4.0–10.5)

## 2016-05-13 LAB — BASIC METABOLIC PANEL
ANION GAP: 9 (ref 5–15)
BUN: 5 mg/dL — AB (ref 6–20)
CHLORIDE: 108 mmol/L (ref 101–111)
CO2: 21 mmol/L — ABNORMAL LOW (ref 22–32)
Calcium: 9.8 mg/dL (ref 8.9–10.3)
Creatinine, Ser: 0.87 mg/dL (ref 0.61–1.24)
Glucose, Bld: 109 mg/dL — ABNORMAL HIGH (ref 65–99)
POTASSIUM: 4.5 mmol/L (ref 3.5–5.1)
SODIUM: 138 mmol/L (ref 135–145)

## 2016-05-13 MED ORDER — OXYCODONE HCL 5 MG PO TABS
5.0000 mg | ORAL_TABLET | Freq: Once | ORAL | Status: AC
  Administered 2016-05-13: 5 mg via ORAL
  Filled 2016-05-13: qty 1

## 2016-05-13 MED ORDER — IBUPROFEN 800 MG PO TABS
800.0000 mg | ORAL_TABLET | Freq: Once | ORAL | Status: DC
  Filled 2016-05-13: qty 1

## 2016-05-13 MED ORDER — ACETAMINOPHEN 500 MG PO TABS
1000.0000 mg | ORAL_TABLET | Freq: Once | ORAL | Status: AC
  Administered 2016-05-13: 1000 mg via ORAL
  Filled 2016-05-13: qty 2

## 2016-05-13 MED ORDER — MORPHINE SULFATE 15 MG PO TABS: 15.0000 mg | ORAL_TABLET | ORAL | 0 refills | Status: AC | PRN

## 2016-05-13 NOTE — Progress Notes (Signed)
Orthopedic Tech Progress Note Patient Details:  RODNY KOSAK [birth year] [id number]  Ortho Devices Type of Ortho Device: Ace wrap, Crutches, Post (long leg) splint, Stirrup splint Ortho Device/Splint Location: RLE Ortho Device/Splint Interventions: Ordered, Application   Braulio Bosch 05/13/2016, 2:52 PM

## 2016-05-13 NOTE — Discharge Instructions (Signed)

## 2016-05-13 NOTE — ED Provider Notes (Signed)
[facility] DEPT Provider Note   CSN: [id number] Arrival date & time: 05/13/16  1148     History   Chief Complaint Chief Complaint  Patient presents with  . Loss of Consciousness    HPI [name] is a 60 y.o. male.  60 yo M with a chief complaint of a syncopal event. This happened just prior to arrival. Patient was sitting in a chair suddenly felt like the world was closing in on him and then woke up on the ground. Per bystanders the patient went limp and fell to the side. Patient inverted his right ankle when this happened. Having severe ankle pain. Also hit his head when he fell. Denies headache neck pain chest pain abdominal pain. Has been worked up extensively for syncope by his cardiologist.   The history is provided by the patient.  Loss of Consciousness   This is a recurrent problem. The current episode started less than 1 hour ago. The problem occurs constantly. The problem has not changed since onset.There was no loss of consciousness. The problem is associated with normal activity. Pertinent negatives include abdominal pain, chest pain, confusion, congestion, fever, headaches, palpitations and vomiting. He has tried nothing for the symptoms. The treatment provided no relief.    Past Medical History:  Diagnosis Date  . Chronic back pain    S/P SPINAL FUSION - SOME INTERMITTEN NUMBNESS RT LEG AND CHRONIC PAIN AND STIFFNESS LUMBAR  . Hypertension     Patient Active Problem List   Diagnosis Date Noted  . Abscess 05/02/2014  . Oral abscess 05/02/2014  . 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 Essential hypertension, benign 06/20/2013  . Chronic back pain     Past Surgical History:  Procedure Laterality Date  . BACK SURGERY     X 2 INCLUDING FUSION OF SPINE  . COLON SURGERY  2011   PARTIAL COLON RESECTION FOR CANCER  . MULTIPLE EXTRACTIONS WITH ALVEOLOPLASTY N/A 07/22/2013   Procedure: EXTRACTIONS WITH REMOVAL OF TORI AND REMOVAL OF  EXOSTOSIS;  Surgeon: [name], DDS;  Location: [facility];  Service: Oral Surgery;  Laterality: N/A;  . TONSILLECTOMY AND ADENOIDECTOMY Left 05/02/2014   Procedure: Incision and and drainage of left oral cavity abscess;  Surgeon: [name], MD;  Location: [facility];  Service: ENT;  Laterality: Left;  . WISDOM TOOTH EXTRACTION         Home Medications    Prior to Admission medications   Medication Sig Start Date End Date Taking? Authorizing Provider  amLODipine (NORVASC) 5 MG tablet Take 5 mg by mouth daily. 10/17/15   Historical Provider, MD  carvedilol (COREG) 3.125 MG tablet Take 3.125 mg by mouth 2 (two) times daily with a meal.    Historical Provider, MD  gemfibrozil (LOPID) 600 MG tablet Take 600 mg by mouth 2 (two) times daily. 10/10/15   Historical Provider, MD  naproxen (NAPROSYN) 500 MG tablet Take 500 mg by mouth 2 (two) times daily as needed for moderate pain.  09/24/15   Historical Provider, MD  ondansetron (ZOFRAN-ODT) 4 MG disintegrating tablet Take 4 mg by mouth every 6 (six) hours as needed. nausea 08/08/15   Historical Provider, MD  oxyCODONE (ROXICODONE) 15 MG immediate release tablet Take 7.5-15 mg by mouth 3 (three) times daily as needed for pain. pain 10/25/15   Historical Provider, MD  predniSONE (DELTASONE) 10 MG tablet Take 10-40 mg by mouth as directed. Reported on 03/06/2016 01/29/16   Historical Provider, MD  Family History No family history on file.  Social History Social History  Substance Use Topics  . Smoking status: Current Every Day Smoker    Packs/day: 0.25    Years: 30.00    Types: Cigarettes  . Smokeless tobacco: Never Used  . Alcohol use Yes     Comment: occasional     Allergies   Vicodin [hydrocodone-acetaminophen]   Review of Systems Review of Systems  Constitutional: Negative for chills and fever.  HENT: Negative for congestion and facial swelling.   Eyes: Negative for discharge and visual disturbance.  Respiratory: Negative for  shortness of breath.   Cardiovascular: Positive for syncope. Negative for chest pain and palpitations.  Gastrointestinal: Negative for abdominal pain, diarrhea and vomiting.  Musculoskeletal: Positive for arthralgias and myalgias.  Skin: Negative for color change and rash.  Neurological: Positive for syncope. Negative for tremors and headaches.  Psychiatric/Behavioral: Negative for confusion and dysphoric mood.     Physical Exam Updated Vital Signs BP 113/91 (BP Location: Right Arm)   Pulse 61   Temp 97.6 F (36.4 C) (Oral)   Resp 20   Ht 6' (1.829 m)   Wt 170 lb (77.1 kg)   SpO2 100%   BMI 23.06 kg/m   Physical Exam  Constitutional: He is oriented to person, place, and time. He appears well-developed and well-nourished.  HENT:  Head: Normocephalic and atraumatic.  Eyes: Conjunctivae and EOM are normal. Pupils are equal, round, and reactive to light.  Neck: Normal range of motion. No JVD present.  Cardiovascular: Normal rate and regular rhythm.   Pulmonary/Chest: Effort normal. No stridor. No respiratory distress.  Abdominal: He exhibits no distension. There is no tenderness. There is no guarding.  Musculoskeletal: Normal range of motion. He exhibits edema and tenderness.  The patient is tender about the medial lateral malleolus. Has swelling to the right ankle. Pulse motor and sensation is intact distally.  Neurological: He is alert and oriented to person, place, and time.  Skin: Skin is warm and dry.  Psychiatric: He has a normal mood and affect. His behavior is normal.     ED Treatments / Results  Labs (all labs ordered are listed, but only abnormal results are displayed) Labs Reviewed  BASIC METABOLIC PANEL - Abnormal; Notable for the following:       Result Value   CO2 21 (*)    Glucose, Bld 109 (*)    BUN 5 (*)    All other components within normal limits  CBC - Abnormal; Notable for the following:    WBC 3.8 (*)    All other components within normal limits    URINALYSIS, ROUTINE W REFLEX MICROSCOPIC (NOT AT [facility])  CBG MONITORING, ED    EKG  EKG Interpretation  Date/Time:  Tuesday May 13 2016 12:02:16 EDT Ventricular Rate:  54 PR Interval:    QRS Duration: 82 QT Interval:  473 QTC Calculation: 449 R Axis:   74 Text Interpretation:  Sinus rhythm Prolonged PR interval Nonspecific T abnrm, anterolateral leads No significant change since last tracing no wpw, prolonged qt or brugada Confirmed by Jackolyn Geron MD, Quillian Quince [id number]) on 05/13/2016 12:23:57 PM Also confirmed by Tyrone Nine MD, DANIEL [phone]), editor Langley, Joelene Millin [phone])  on 05/13/2016 12:35:03 PM       Radiology Dg Ankle Complete Right  Result Date: 05/13/2016 CLINICAL DATA:  Ankle pain and swelling.  Fell today. EXAM: RIGHT ANKLE - COMPLETE 3+ VIEW COMPARISON:  None. FINDINGS: There is a nondisplaced oblique coursing distal fibular  fracture at and above the level of the ankle mortise. There is also a small avulsion fracture off the distal tip of the medial malleolus. No definite ankle joint effusion. The talus and subtalar joints are normal. IMPRESSION: Nondisplaced distal fibular fracture and small medial malleolus avulsion fracture. Electronically Signed   By: Marijo Sanes M.D.   On: 05/13/2016 13:23   Ct Head Wo Contrast  Result Date: 05/13/2016 CLINICAL DATA:  59 year old male with history of syncopal event complicated by a fall with injury to the right side of the forehead. EXAM: CT HEAD WITHOUT CONTRAST TECHNIQUE: Contiguous axial images were obtained from the base of the skull through the vertex without intravenous contrast. COMPARISON:  Head CT 03/06/2016. FINDINGS: Brain: No evidence of acute infarction, hemorrhage, hydrocephalus, extra-axial collection or mass lesion/mass effect. Mild cerebral atrophy. Vascular: No hyperdense vessel or unexpected calcification. Skull: Normal. Negative for fracture or focal lesion. Sinuses/Orbits: No acute finding. Other: None. IMPRESSION: 1. No evidence  of significant acute traumatic injury to the skull or brain. 2. Mild cerebral atrophy. Electronically Signed   By: Vinnie Langton M.D.   On: 05/13/2016 13:05    Procedures Procedures (including critical care time)  Medications Ordered in ED Medications  ibuprofen (ADVIL,MOTRIN) tablet 800 mg (800 mg Oral Refused 05/13/16 1230)  acetaminophen (TYLENOL) tablet 1,000 mg (1,000 mg Oral Given 05/13/16 1230)  oxyCODONE (Oxy IR/ROXICODONE) immediate release tablet 5 mg (5 mg Oral Given 05/13/16 1230)     Initial Impression / Assessment and Plan / ED Course  I have reviewed the triage vital signs and the nursing notes.  Pertinent labs & imaging results that were available during my care of the patient were reviewed by me and considered in my medical decision making (see chart for details).  Clinical Course    60 yo M With a chief complaint of a syncopal event. Patient's EKG is unremarkable he's not anemic and has no lateral abnormality. We'll have him follow with his cardiologist for this. During the syncopal event patient did have a right Weber a ankle fracture. We'll place in a splint and have him follow with orthopedics.  2:15 PM:  I have discussed the diagnosis/risks/treatment options with the patient and family and believe the pt to be eligible for discharge home to follow-up with PCP. We also discussed returning to the ED immediately if new or worsening sx occur. We discussed the sx which are most concerning (e.g., sudden worsening pain, fever, inability to tolerate by mouth) that necessitate immediate return. Medications administered to the patient during their visit and any new prescriptions provided to the patient are listed below.  Medications given during this visit Medications  ibuprofen (ADVIL,MOTRIN) tablet 800 mg (800 mg Oral Refused 05/13/16 1230)  acetaminophen (TYLENOL) tablet 1,000 mg (1,000 mg Oral Given 05/13/16 1230)  oxyCODONE (Oxy IR/ROXICODONE) immediate release tablet 5 mg (5  mg Oral Given 05/13/16 1230)     The patient appears reasonably screen and/or stabilized for discharge and I doubt any other medical condition or other [facility] requiring further screening, evaluation, or treatment in the ED at this time prior to discharge.    Final Clinical Impressions(s) / ED Diagnoses   Final diagnoses:  None    New Prescriptions New Prescriptions   No medications on file     Deno Etienne, DO 05/13/16 1416

## 2016-05-13 NOTE — ED Notes (Signed)
Placed patient into a gown on a monitor did ekg waiting for doctor

## 2016-05-13 NOTE — ED Triage Notes (Signed)
Pt. Was at work and fell forward off  His bench and hit his head, scraped his knee, and hurt his ankle. He did lose consciousness and vomited and is not on a blood thinner  CBG 123 BP140/90 HR: sinus around 60

## 2016-05-13 NOTE — ED Notes (Signed)
DC instructions reviewed. Pt. Verbalizes understanding and leaving with his girlfriend. Splint applied and understands use of crutches.

## 2016-05-22 IMAGING — DX DG HIP COMPLETE 2+V*R*
3 series · 3 of 3 positions shown · non-contrast
Comparison: 05/02/2012. Lumbar series from today reported
separately.

CLINICAL DATA: 57-year-old male status post fall from steps onto
concrete with right side pain. Initial encounter.

EXAM:
RIGHT HIP - COMPLETE 2+ VIEW

[pelvis ap]
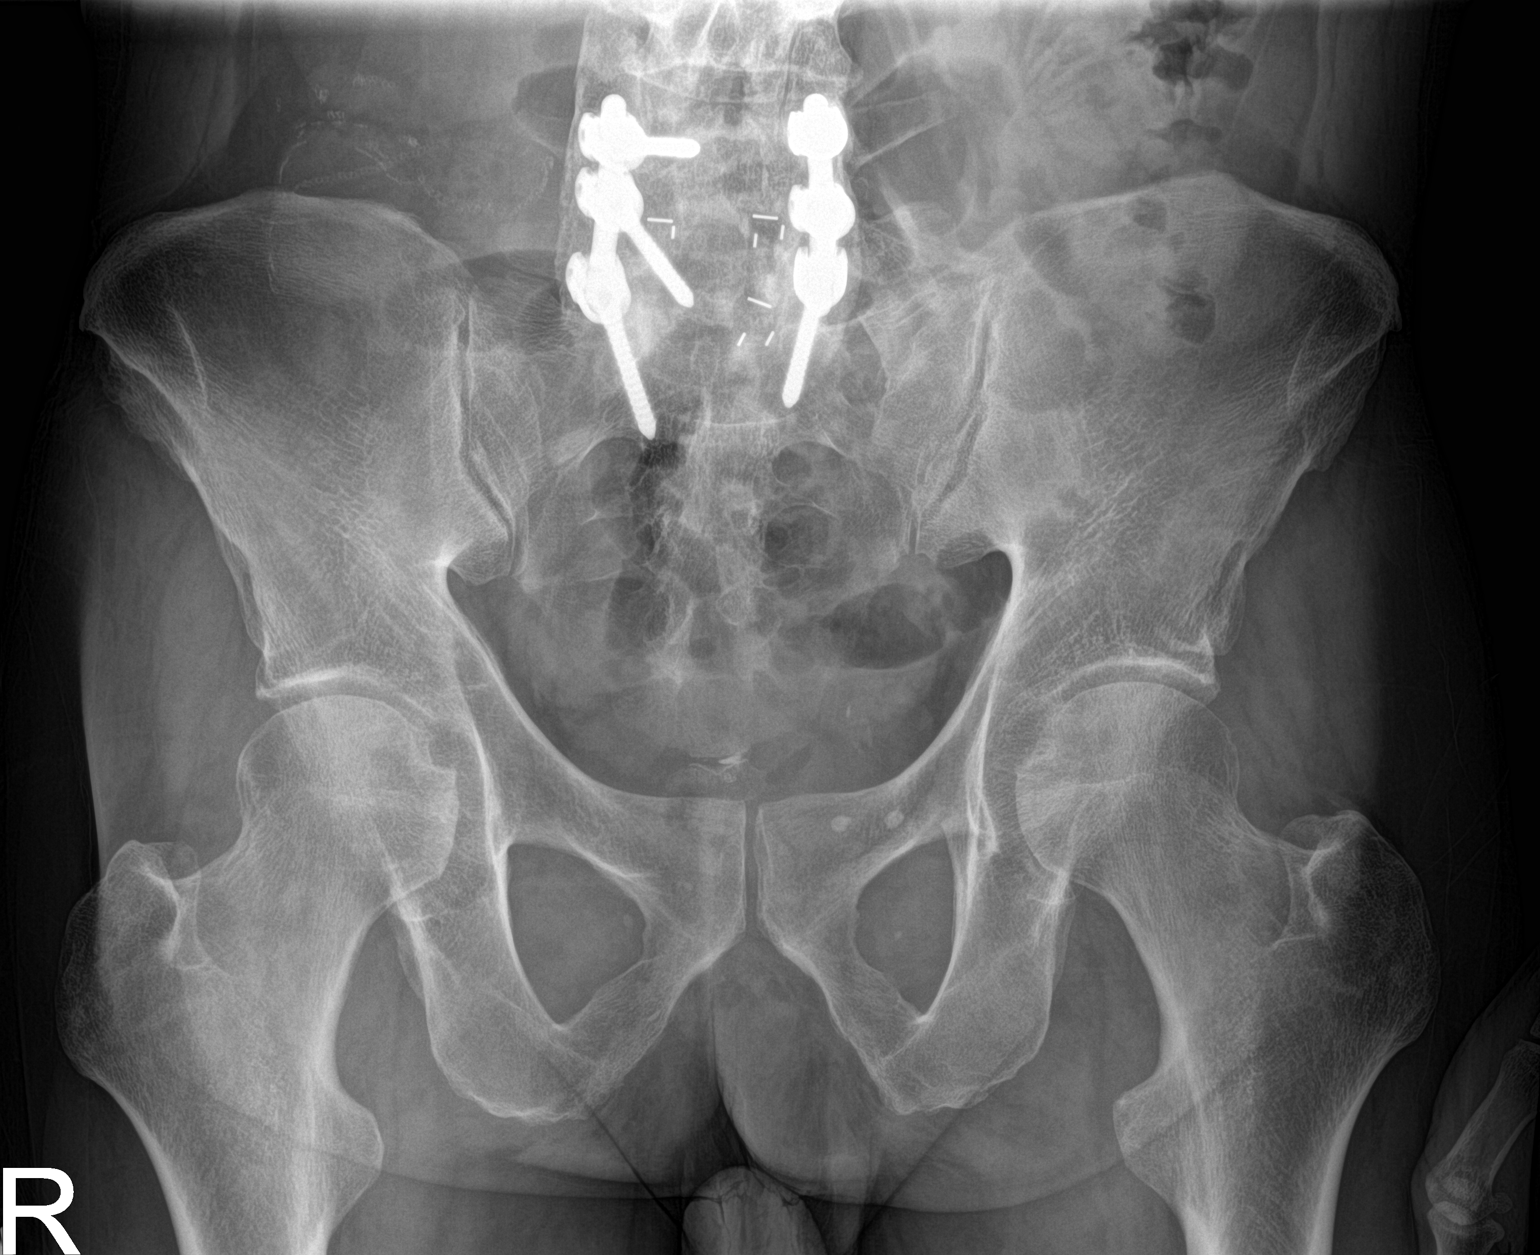

[hip ap]
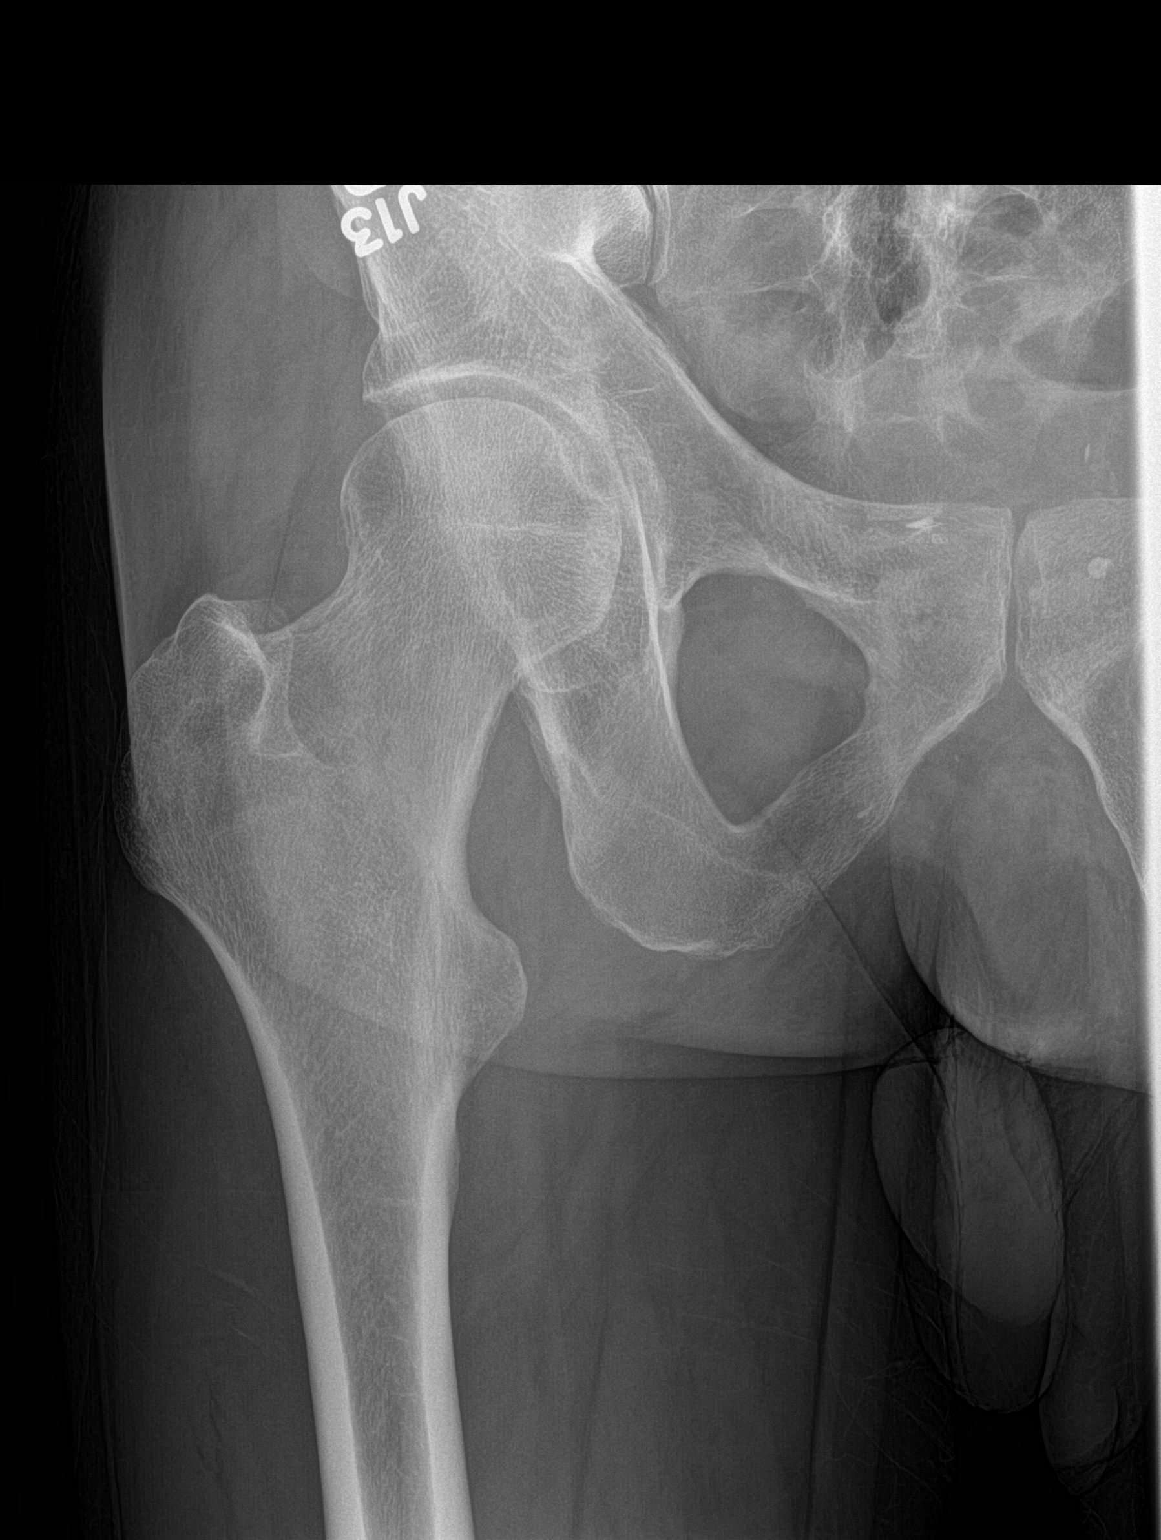

[hip lat]
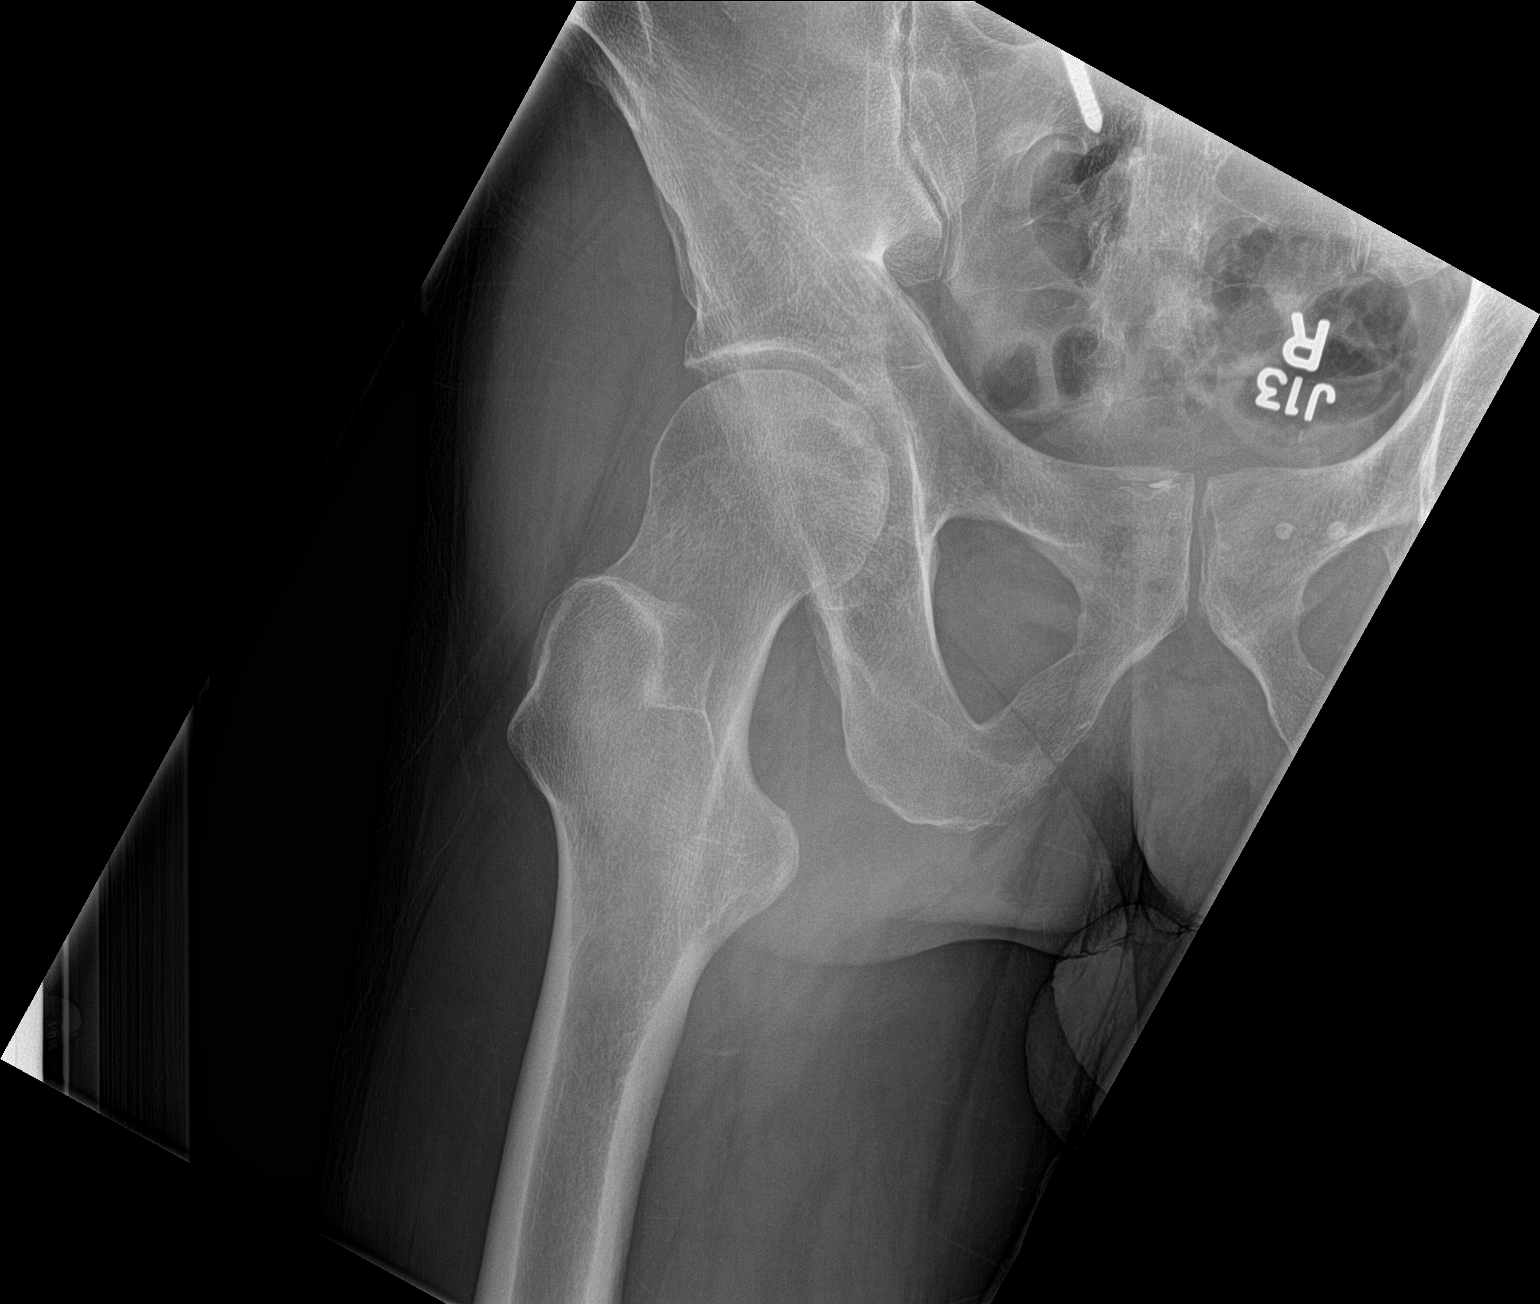

[3 of 3 positions shown; findings below may reference images not displayed]

FINDINGS: Femoral heads remain normally located. Hip joint spaces are
preserved. Pelvis is intact. Sacrum appears intact. Proximal left
femur grossly intact. Proximal right femur stable and intact.
IMPRESSION: No acute fracture or dislocation identified about the right hip or
pelvis..

## 2016-11-06 NOTE — Progress Notes (Signed)
Received a call from [place] at [facility] in regards to a new patient referral. Gave her the fax number and information needed so that we can see patient. She advised that patient was a new patient for them and that they had only saw him this one time. Advised her that we would need their office to retrieve and send all of the patient's medical records so we can get the patient's appointment scheduled

## 2016-11-19 ENCOUNTER — Encounter: Payer: Self-pay | Admitting: Hematology

## 2016-11-19 ENCOUNTER — Telehealth: Payer: Self-pay | Admitting: Hematology

## 2016-11-19 NOTE — Telephone Encounter (Signed)
Received a call from the referring office. Appt has been scheduled for the pt to see Dr. Burr Medico on 4/16 at 11am. Referring office will notify the pt. Letter mailed.

## 2016-12-22 ENCOUNTER — Ambulatory Visit: Payer: Medicaid Other | Admitting: Hematology

## 2018-05-02 ENCOUNTER — Other Ambulatory Visit: Payer: Self-pay

## 2018-05-02 ENCOUNTER — Encounter (HOSPITAL_COMMUNITY): Payer: Self-pay | Admitting: Emergency Medicine

## 2018-05-02 ENCOUNTER — Emergency Department (HOSPITAL_COMMUNITY)
Admission: EM | Admit: 2018-05-02 | Discharge: 2018-05-02 | Disposition: A | Discharge status: Home / Self Care | Payer: Medicaid Other | Attending: Emergency Medicine | Admitting: Emergency Medicine

## 2018-05-02 DIAGNOSIS — I10 Essential (primary) hypertension: Secondary | ICD-10-CM | POA: Insufficient documentation

## 2018-05-02 DIAGNOSIS — M5441 Lumbago with sciatica, right side: Secondary | ICD-10-CM | POA: Diagnosis not present

## 2018-05-02 DIAGNOSIS — Z79899 Other long term (current) drug therapy: Secondary | ICD-10-CM | POA: Diagnosis not present

## 2018-05-02 DIAGNOSIS — F17210 Nicotine dependence, cigarettes, uncomplicated: Secondary | ICD-10-CM | POA: Diagnosis not present

## 2018-05-02 DIAGNOSIS — M545 Low back pain: Secondary | ICD-10-CM | POA: Diagnosis present

## 2018-05-02 MED ORDER — DEXAMETHASONE 4 MG PO TABS
10.0000 mg | ORAL_TABLET | Freq: Once | ORAL | Status: AC
  Administered 2018-05-02: 10 mg via ORAL
  Filled 2018-05-02: qty 3

## 2018-05-02 MED ORDER — HYDROMORPHONE HCL 1 MG/ML IJ SOLN
1.0000 mg | Freq: Once | INTRAMUSCULAR | Status: AC
  Administered 2018-05-02: 1 mg via INTRAMUSCULAR
  Filled 2018-05-02: qty 1

## 2018-05-02 MED ORDER — FENTANYL CITRATE (PF) 100 MCG/2ML IJ SOLN: 50.0000 ug | Freq: Once | INTRAMUSCULAR | Status: DC

## 2018-05-02 MED ORDER — METHOCARBAMOL 500 MG PO TABS: 500.0000 mg | ORAL_TABLET | Freq: Once | ORAL | Status: DC

## 2018-05-02 MED ORDER — DICLOFENAC SODIUM 1 % TD GEL: 4.0000 g | Freq: Four times a day (QID) | TRANSDERMAL | 0 refills | Status: AC

## 2018-05-02 MED ORDER — LIDOCAINE 5 % EX PTCH
1.0000 | MEDICATED_PATCH | CUTANEOUS | Status: DC
  Administered 2018-05-02: 1 via TRANSDERMAL
  Filled 2018-05-02: qty 1

## 2018-05-02 NOTE — ED Triage Notes (Signed)
Pt. Stated, Im having really bad back pain started yesterday evening. I got to Dr. Gershon Mussel but could not wait cause of the pain

## 2018-05-02 NOTE — ED Provider Notes (Addendum)
[facility]PART[facility] Provider Note   CSN: [id number] Arrival date & time: 05/02/18  5852     History   Chief Complaint Chief Complaint  Patient presents with  . Back Pain    HPI [name] is a 62 y.o. male history of hypertension, chronic back pain status post lumbar fusion who presents emergency department today for back pain.  Patient reports that he was hanging sheet rock yesterday when he had the onset of right sided low back pain.  He reports the pain is been constant since onset and worsened with ambulation, palpation, range of motion and describes it as a throbbing pain.  He notes the pain radiates down his right leg and is similar to his episodes of sciatica in the past.  The patient reports he has been taking his Tylenol and was taking his oxycodone prescribed to him by his PCP but ran out yesterday.  He reports that he is not able to get in with his PCP until tomorrow for a refill on his oxycodone.  He is requesting something for pain. Denies history of cancer, trauma, fever, IV drug use, recent spinal procedures, upper back pain or neck pain, numbness/tingling/weakness of the lower extremities, urinary retention, loss of bowel/bladder function, saddle anesthesia, or unexplained weight loss. Denies dysuria, flank pain, abdominal pain, frequency, urgency, or hematuria. He has been using a walker to assist with the pain.   HPI  Past Medical History:  Diagnosis Date  . Chronic back pain    S/P SPINAL FUSION - SOME INTERMITTEN NUMBNESS RT LEG AND CHRONIC PAIN AND STIFFNESS LUMBAR  . Hypertension     Patient Active Problem List   Diagnosis Date Noted  . Abscess 05/02/2014  . Oral abscess 05/02/2014  . 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 Essential hypertension, benign 06/20/2013  . Chronic back pain     Past Surgical History:  Procedure Laterality Date  . BACK SURGERY     X 2 INCLUDING FUSION OF SPINE  . COLON SURGERY   2011   PARTIAL COLON RESECTION FOR CANCER  . MULTIPLE EXTRACTIONS WITH ALVEOLOPLASTY N/A 07/22/2013   Procedure: EXTRACTIONS WITH REMOVAL OF TORI AND REMOVAL OF EXOSTOSIS;  Surgeon: [name], DDS;  Location: [facility];  Service: Oral Surgery;  Laterality: N/A;  . TONSILLECTOMY AND ADENOIDECTOMY Left 05/02/2014   Procedure: Incision and and drainage of left oral cavity abscess;  Surgeon: [name], MD;  Location: [facility];  Service: ENT;  Laterality: Left;  . WISDOM TOOTH EXTRACTION          Home Medications    Prior to Admission medications   Medication Sig Start Date End Date Taking? Authorizing Provider  amLODipine (NORVASC) 5 MG tablet Take 5 mg by mouth daily. 10/17/15   [provider]  carvedilol (COREG) 3.125 MG tablet Take 3.125 mg by mouth 2 (two) times daily with a meal.    [provider]  gemfibrozil (LOPID) 600 MG tablet Take 600 mg by mouth 2 (two) times daily. 10/10/15   [provider]  morphine (MSIR) 15 MG tablet Take 1 tablet (15 mg total) by mouth every 4 (four) hours as needed for severe pain. 05/13/16   Deno Etienne, DO  naproxen (NAPROSYN) 500 MG tablet Take 500 mg by mouth 2 (two) times daily as needed for moderate pain.  09/24/15   [provider]  ondansetron (ZOFRAN-ODT) 4 MG disintegrating tablet Take 4 mg by mouth every 6 (six)  hours as needed. nausea 08/08/15   [provider]  oxyCODONE (ROXICODONE) 15 MG immediate release tablet Take 7.5-15 mg by mouth 3 (three) times daily as needed for pain. pain 10/25/15   [provider]  predniSONE (DELTASONE) 10 MG tablet Take 10-40 mg by mouth as directed. Reported on 03/06/2016 01/29/16   [provider]    Family History No family history on file.  Social History Social History   Tobacco Use  . Smoking status: Current Every Day Smoker    Packs/day: 0.25    Years: 30.00    Pack years: 7.50    Types: Cigarettes  . Smokeless tobacco: Never Used    Substance Use Topics  . Alcohol use: Yes    Comment: occasional  . Drug use: No     Allergies   Vicodin [hydrocodone-acetaminophen]   Review of Systems Review of Systems  Constitutional: Negative for fever.  Gastrointestinal: Negative for abdominal pain.       No bowel incontinence  Genitourinary:       No bladder incontinence  Musculoskeletal: Positive for back pain.  Skin: Negative for rash.  Neurological: Negative for weakness and numbness.  All other systems reviewed and are negative.    Physical Exam Updated Vital Signs BP (!) 135/98 (BP Location: Left Arm)   Pulse 93   Temp 97.6 F (36.4 C) (Oral)   Resp 17   Ht 6' (1.829 m)   Wt 77.1 kg   SpO2 100%   BMI 23.06 kg/m   Physical Exam  Constitutional: He appears well-developed and well-nourished. No distress.  Non-toxic appearing  HENT:  Head: Normocephalic and atraumatic.  Right Ear: External ear normal.  Left Ear: External ear normal.  Neck: Normal range of motion. Neck supple. No spinous process tenderness present. No neck rigidity. Normal range of motion present.  Cardiovascular: Normal rate, regular rhythm, normal heart sounds and intact distal pulses.  No murmur heard. Pulses:      Radial pulses are 2+ on the right side, and 2+ on the left side.       Femoral pulses are 2+ on the right side, and 2+ on the left side.      Dorsalis pedis pulses are 2+ on the right side, and 2+ on the left side.       Posterior tibial pulses are 2+ on the right side, and 2+ on the left side.  Pulmonary/Chest: Effort normal and breath sounds normal. No respiratory distress.  Abdominal: Soft. Bowel sounds are normal. He exhibits no pulsatile midline mass. There is no tenderness. There is no rigidity, no rebound, no guarding and no CVA tenderness.  No abdominal tenderness palpation.  No peritoneal signs  Musculoskeletal:  Posterior and appearance appears normal. No evidence of obvious scoliosis or kyphosis. No obvious  signs of skin changes, trauma, deformity, infection. No C, T, or L spine tenderness or step-offs to palpation. No C, T paraspinal tenderness. Right lumbar paraspinal ttp. No hip ttp or leg shortening. Lung expansion normal. Bilateral lower extremity strength 5 out of 5 including extensor hallucis longus. Patellar and Achilles deep tendon reflex 2+ and equal bilaterally. Sensation of lower extremities grossly intact. Gait able but patient notes painful. Lower extremity compartments soft. PT and DP 2+ b/l. Cap refill <2 seconds.   Neurological: He is alert. No sensory deficit.  No foot drop. Able but painful gait.   Skin: Skin is warm, dry and intact. Capillary refill takes less than 2 seconds. No rash noted.  He is not diaphoretic. No erythema.  No vesicular-like rash noted.  Nursing note and vitals reviewed.    ED Treatments / Results  Labs (all labs ordered are listed, but only abnormal results are displayed) Labs Reviewed - No data to display  EKG None  Radiology No results found.  Procedures Procedures (including critical care time)  Medications Ordered in ED Medications  dexamethasone (DECADRON) tablet 10 mg (has no administration in time range)  HYDROmorphone (DILAUDID) injection 1 mg (has no administration in time range)  lidocaine (LIDODERM) 5 % 1 patch (has no administration in time range)     Initial Impression / Assessment and Plan / ED Course  I have reviewed the triage vital signs and the nursing notes.  Pertinent labs & imaging results that were available during my care of the patient were reviewed by me and considered in my medical decision making (see chart for details).     62 y.o. male with atraumatic right lower back pain with radiation into his right leg similar to prior episodes of sciatica.  He is neurovascularly intact.  No neurological deficits and normal neuro exam.  Patient can walk but states is painful.  No loss of bowel or bladder control.  No concern  for cauda equina.  No trauma, urinary symptoms, fever, night sweats, weight loss, h/o cancer, IVDU.  No abdominal tenderness.  No indication for x-ray, CT or lab work at this time.  Patient's pain treated in the department.  He was given dose of Decadron.  He states he has follow-up with his primary care provider tomorrow for refill on pain medication.  Patient reviewed in the [place] controlled substance database with 63 oxycodone prescribed on 04/12/18 and 42 oxycodone on 7/22.  Do not feel comfortable prescribing patient narcotic pain medication at this time.  Recommended conservative therapy.  Strict return precautions were discussed.  Patient appears safe for discharge.  Final Clinical Impressions(s) / ED Diagnoses   Final diagnoses:  Acute right-sided low back pain with right-sided sciatica    ED Discharge Orders         Ordered    diclofenac sodium (VOLTAREN) 1 % GEL  4 times daily     05/02/18 1059           Jillyn Ledger, PA-C 05/02/18 1100    Lorelle Gibbs 05/02/18 1101    Carmin Muskrat, MD 05/02/18 1650

## 2018-05-02 NOTE — Discharge Instructions (Addendum)
Follow up with your doctor tomorrow for a refill on your medications and for follow up. I have given you a referral to orthopedics for an as needed basis. See attached handouts.  You are given steroids in the department to help with your sciatica.  Your pain was treated in the department.  Please continue taking Tylenol for your symptoms.  Apply Voltaren to affected area as prescribed. Return if  You have weakness or numbness in one or both of your legs or feet. You have fever You cannot feel when you wipe after using the restroom You develop redness or swelling of your back You have weakness of your legs You have trouble controlling your bladder or your bowels. You have nausea or vomiting. You have pain in your abdomen. You have shortness of breath or you faint

## 2018-05-02 NOTE — ED Notes (Signed)
Patient verbalizes understanding of discharge instructions. Opportunity for questioning and answers were provided. Armband removed by staff, pt discharged from ED ambulatory to wait for ride.

## 2019-07-27 ENCOUNTER — Other Ambulatory Visit: Payer: Self-pay | Admitting: Gastroenterology

## 2019-07-27 DIAGNOSIS — B182 Chronic viral hepatitis C: Secondary | ICD-10-CM

## 2019-07-28 ENCOUNTER — Ambulatory Visit
Admission: RE | Admit: 2019-07-28 | Discharge: 2019-07-28 | Disposition: A | Discharge status: Home / Self Care | Payer: Medicaid Other | Source: Ambulatory Visit | Attending: Gastroenterology | Admitting: Gastroenterology

## 2019-07-28 DIAGNOSIS — B182 Chronic viral hepatitis C: Secondary | ICD-10-CM

## 2020-04-24 ENCOUNTER — Ambulatory Visit: Payer: Medicaid Other | Attending: Internal Medicine

## 2020-04-24 DIAGNOSIS — Z23 Encounter for immunization: Secondary | ICD-10-CM

## 2020-04-24 NOTE — Progress Notes (Signed)
   Covid-19 Vaccination Clinic  Name:  [name]    MRN: [id number] DOB: [birth year]  04/24/2020  [name] was observed post Covid-19 immunization for 15 minutes without incident. He was provided with Vaccine Information Sheet and instruction to access the V-Safe system.   [name] was instructed to call 911 with any severe reactions post vaccine: Marland Kitchen Difficulty breathing  . Swelling of face and throat  . A fast heartbeat  . A bad rash all over body  . Dizziness and weakness   Immunizations Administered    Name Date Dose VIS Date Route   Pfizer COVID-19 Vaccine 04/24/2020  3:37 PM 0.3 mL 11/02/2018 Intramuscular   Manufacturer: [place]   Lot: [id number]   [place]: [id number]

## 2020-05-15 ENCOUNTER — Ambulatory Visit: Payer: Medicaid Other
